# Patient Record
Sex: Female | Born: 1955 | Race: Black or African American | Hispanic: No | Marital: Married | Smoking: Never smoker
Health system: Southern US, Community
[De-identification: ages and names within clinical notes are randomized; demographics above are authoritative.]

## PROBLEM LIST (undated history)

## (undated) DIAGNOSIS — E079 Disorder of thyroid, unspecified: Secondary | ICD-10-CM

## (undated) DIAGNOSIS — K5190 Ulcerative colitis, unspecified, without complications: Secondary | ICD-10-CM

## (undated) DIAGNOSIS — I10 Essential (primary) hypertension: Secondary | ICD-10-CM

## (undated) DIAGNOSIS — F32A Depression, unspecified: Secondary | ICD-10-CM

## (undated) HISTORY — DX: Depression, unspecified: F32.A

## (undated) HISTORY — DX: Essential (primary) hypertension: I10

## (undated) HISTORY — DX: Ulcerative colitis, unspecified, without complications: K51.90

## (undated) HISTORY — DX: Disorder of thyroid, unspecified: E07.9

## (undated) HISTORY — PX: APPENDECTOMY: SHX54

---

## 2003-03-02 ENCOUNTER — Encounter: Payer: Self-pay | Admitting: Gastroenterology

## 2003-03-02 ENCOUNTER — Ambulatory Visit (HOSPITAL_COMMUNITY): Admission: RE | Admit: 2003-03-02 | Discharge: 2003-03-02 | Payer: Self-pay | Admitting: Gastroenterology

## 2003-03-16 ENCOUNTER — Encounter: Payer: Self-pay | Admitting: Gastroenterology

## 2003-03-16 ENCOUNTER — Ambulatory Visit (HOSPITAL_COMMUNITY): Admission: RE | Admit: 2003-03-16 | Discharge: 2003-03-16 | Payer: Self-pay | Admitting: Gastroenterology

## 2003-11-08 ENCOUNTER — Ambulatory Visit (HOSPITAL_COMMUNITY): Admission: RE | Admit: 2003-11-08 | Discharge: 2003-11-08 | Payer: Self-pay | Admitting: Gastroenterology

## 2005-01-01 IMAGING — CT CT ABDOMEN W/ CM
1 of 6 series · 14 of 32 positions shown, 19 images · IV contrast (omnipaque)
Comparison: Comparison is made with the 03/02/03 study.

CLINICAL DATA: Elevated liver function tests.  Liver lesions noted on previous CT and MR.  Please evaluate.
 CT ABDOMEN WITH CONTRAST
TECHNIQUE: Multidetector helical study performed during IV contrast enhancement with 100 cc of Omnipaque 300 injected at 2.5 cc/sec.

[Series 2: abd/pelvis 5.0 b30f · axial · 0.74mm/px · z∈[+826,+1216]mm · 14 of 90 slices shown, 19 images]
[im 6/90  soft-tissue]
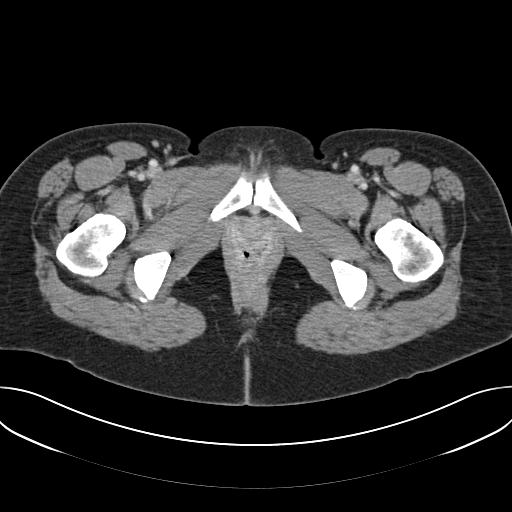
[im 6/90  bone]
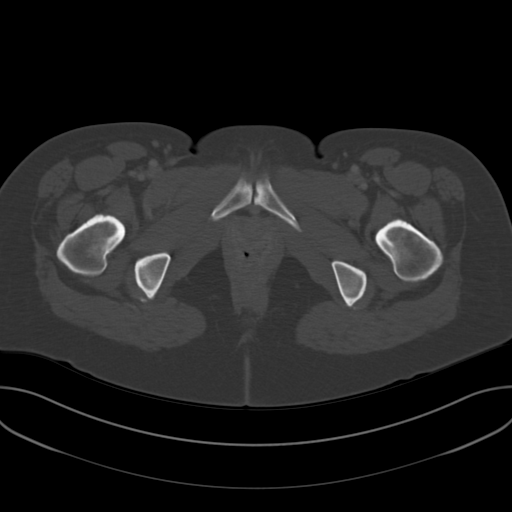
[im 12/90  soft-tissue]
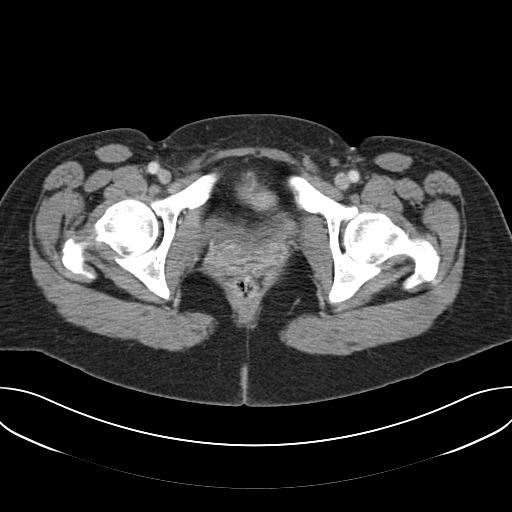
[im 17/90  soft-tissue]
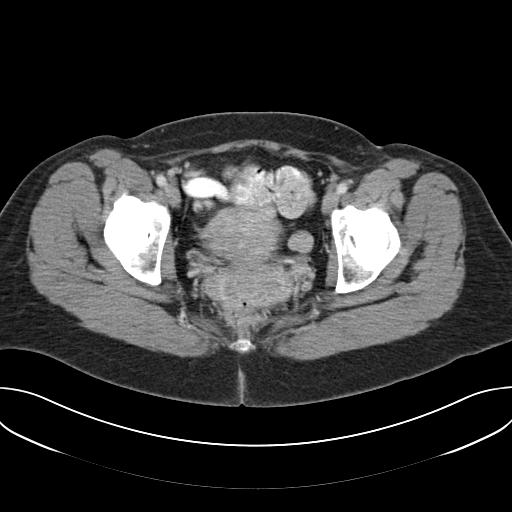
[im 28/90  soft-tissue]
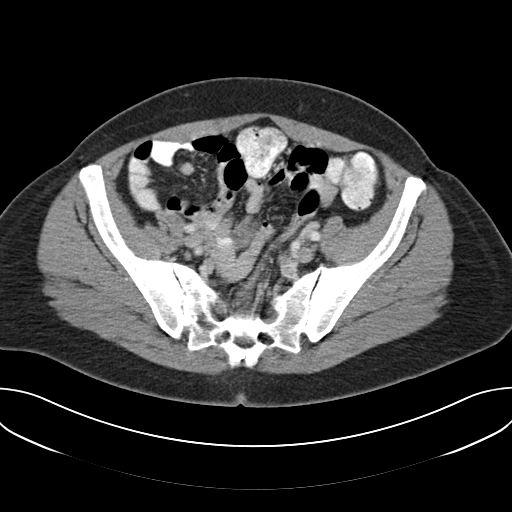
[im 34/90  soft-tissue]
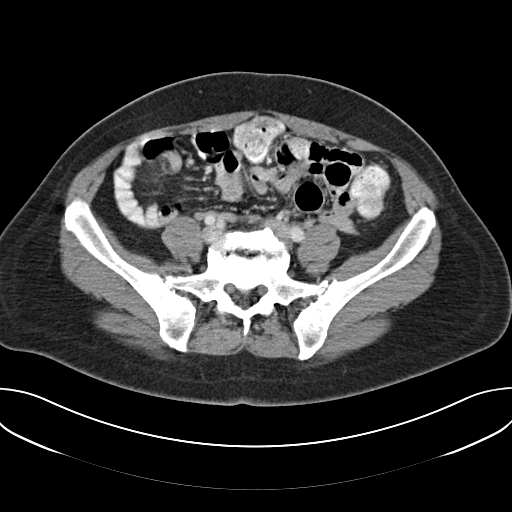
[im 39/90  soft-tissue]
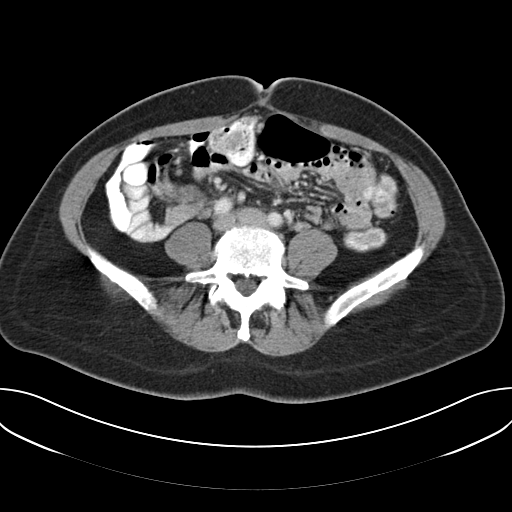
[im 45/90  soft-tissue]
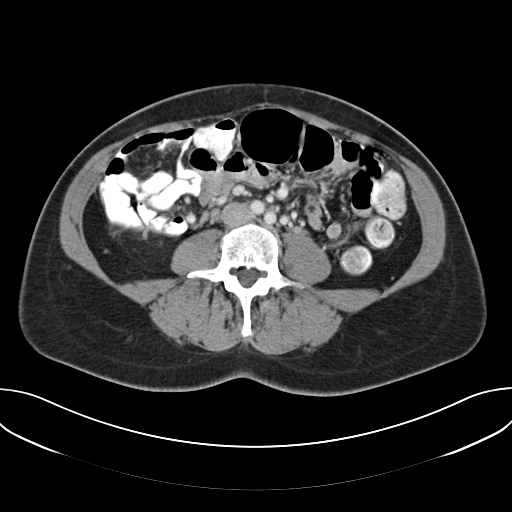
[im 51/90  soft-tissue]
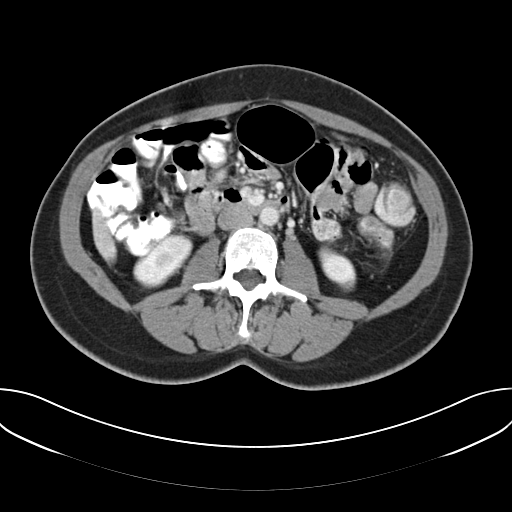
[im 56/90  soft-tissue]
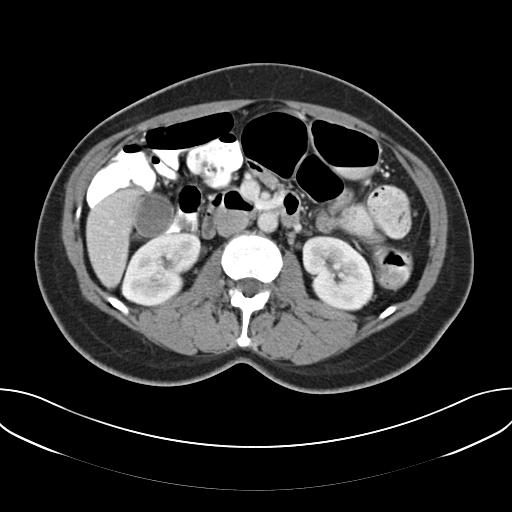
[im 56/90  bone]
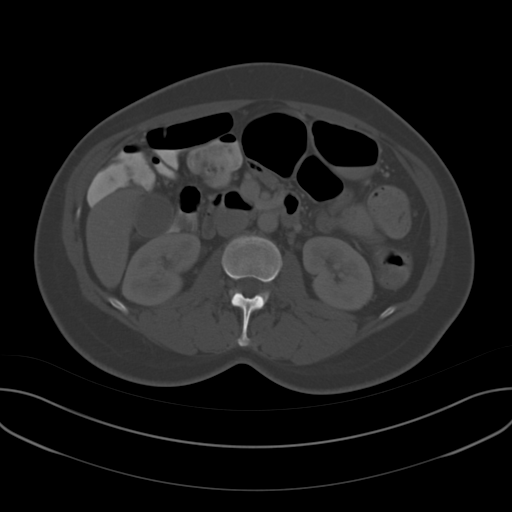
[im 62/90  soft-tissue]
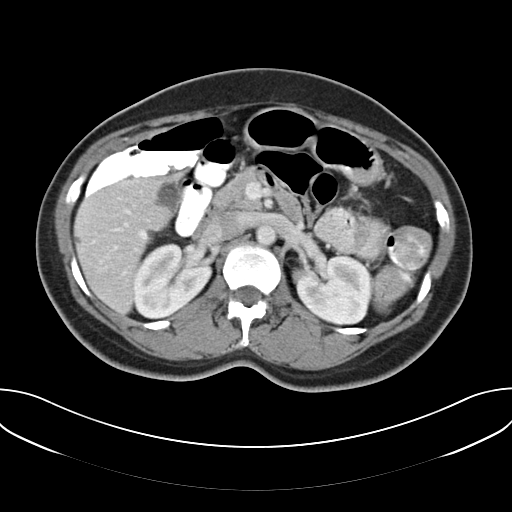
[im 67/90  lung]
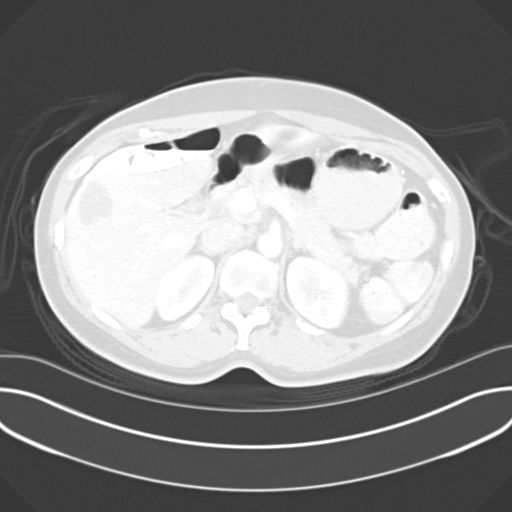
[im 73/90  soft-tissue]
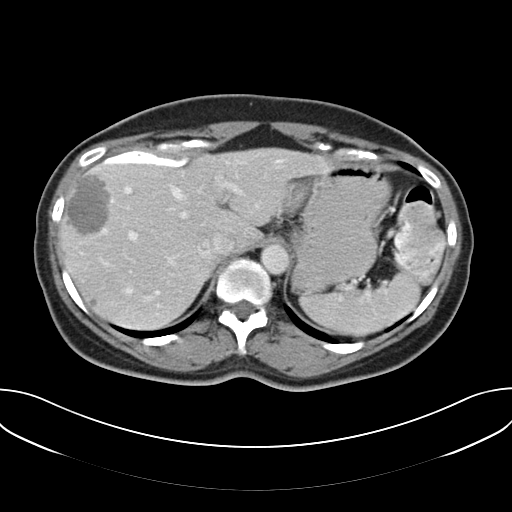
[im 73/90  lung]
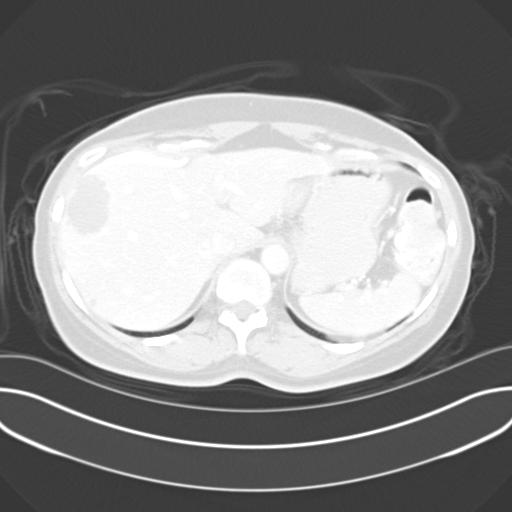
[im 78/90  soft-tissue]
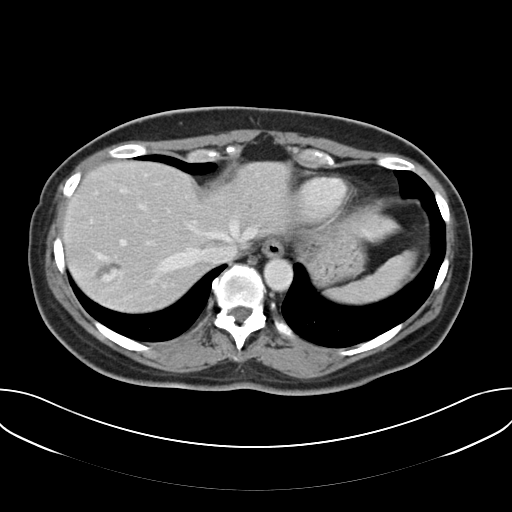
[im 78/90  lung]
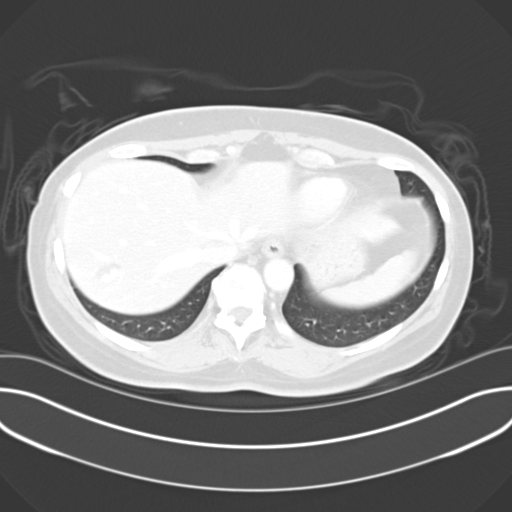
[im 84/90  soft-tissue]
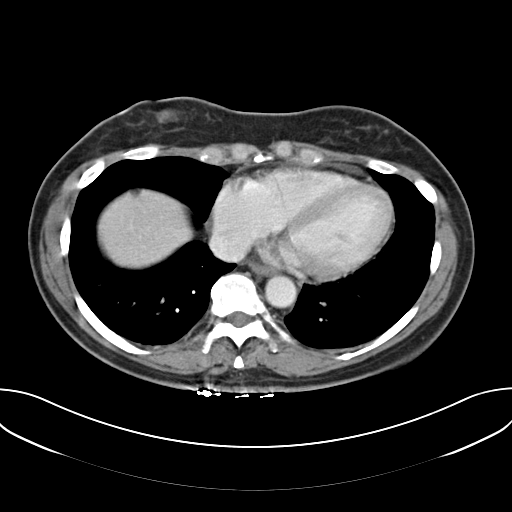
[im 84/90  lung]
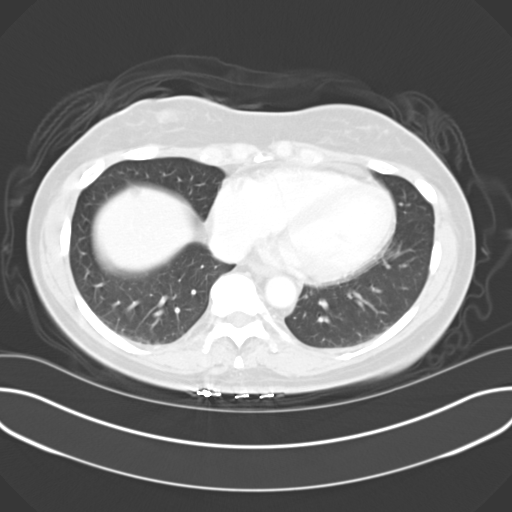

[14 of 32 positions shown; findings below may reference images not displayed]

FINDINGS: Again seen are the two lesions within the right lobe of the liver with peripheral nodular enhancement on the early portal venous phase.  These lesions fill in peripherally on the delayed scans and would be compatible with hemangiomata.  These have also undergone MR scanning and were compatible with hemangiomata on that evaluation.  There are also small stable cysts seen within the liver.  There is no intrahepatic or extrahepatic biliary dilatation.  The pancreas, spleen, kidneys, and adrenal glands have a normal appearance, and there is no retroperitoneal or mesenteric adenopathy.  There is no ascites.  The gallbladder is visualized and has a normal appearance.  
 IMPRESSION
 1.  Stable right lobe hepatic lesions with peripheral nodular enhancement and imaging characteristics consistent with hemangiomata.  
 2.  Stable small hepatic cysts.  
 3.  Otherwise negative CT of the abdomen. 
 CT PELVIS WITH CONTRAST 
 The uterus is prominent in size, but unchanged in appearance.  There may be multiple fibroids within the uterus.  This is more sensitively evaluated by pelvic ultrasound.  There is no free pelvic fluid.  No evidence for diverticulitis.  
 IMPRESSION
 1.  Prominent uterus -- unchanged in appearance when compared with the prior study -- question fibroid uterus.
 2.  Otherwise negative CT of the pelvis.

## 2014-06-09 ENCOUNTER — Encounter (INDEPENDENT_AMBULATORY_CARE_PROVIDER_SITE_OTHER): Payer: BC Managed Care – PPO | Admitting: Ophthalmology

## 2014-06-09 DIAGNOSIS — H4310 Vitreous hemorrhage, unspecified eye: Secondary | ICD-10-CM

## 2014-06-09 DIAGNOSIS — H2510 Age-related nuclear cataract, unspecified eye: Secondary | ICD-10-CM

## 2014-06-09 DIAGNOSIS — H33309 Unspecified retinal break, unspecified eye: Secondary | ICD-10-CM

## 2014-06-10 ENCOUNTER — Encounter (INDEPENDENT_AMBULATORY_CARE_PROVIDER_SITE_OTHER): Payer: BC Managed Care – PPO | Admitting: Ophthalmology

## 2014-06-10 DIAGNOSIS — H33309 Unspecified retinal break, unspecified eye: Secondary | ICD-10-CM

## 2014-06-17 ENCOUNTER — Ambulatory Visit (INDEPENDENT_AMBULATORY_CARE_PROVIDER_SITE_OTHER): Payer: BC Managed Care – PPO | Admitting: Ophthalmology

## 2014-06-17 DIAGNOSIS — H33309 Unspecified retinal break, unspecified eye: Secondary | ICD-10-CM

## 2014-10-18 ENCOUNTER — Ambulatory Visit (INDEPENDENT_AMBULATORY_CARE_PROVIDER_SITE_OTHER): Payer: BC Managed Care – PPO | Admitting: Ophthalmology

## 2018-07-17 ENCOUNTER — Ambulatory Visit (INDEPENDENT_AMBULATORY_CARE_PROVIDER_SITE_OTHER): Payer: BC Managed Care – PPO | Admitting: Internal Medicine

## 2022-12-24 ENCOUNTER — Encounter (INDEPENDENT_AMBULATORY_CARE_PROVIDER_SITE_OTHER): Payer: BC Managed Care – PPO | Admitting: Ophthalmology

## 2022-12-25 ENCOUNTER — Encounter (INDEPENDENT_AMBULATORY_CARE_PROVIDER_SITE_OTHER): Payer: BC Managed Care – PPO | Admitting: Ophthalmology

## 2022-12-25 ENCOUNTER — Ambulatory Visit (INDEPENDENT_AMBULATORY_CARE_PROVIDER_SITE_OTHER): Payer: Medicare PPO | Admitting: Ophthalmology

## 2022-12-25 ENCOUNTER — Encounter (INDEPENDENT_AMBULATORY_CARE_PROVIDER_SITE_OTHER): Payer: Self-pay | Admitting: Ophthalmology

## 2022-12-25 DIAGNOSIS — I10 Essential (primary) hypertension: Secondary | ICD-10-CM | POA: Diagnosis not present

## 2022-12-25 DIAGNOSIS — H34832 Tributary (branch) retinal vein occlusion, left eye, with macular edema: Secondary | ICD-10-CM | POA: Diagnosis not present

## 2022-12-25 DIAGNOSIS — H3581 Retinal edema: Secondary | ICD-10-CM

## 2022-12-25 DIAGNOSIS — H35033 Hypertensive retinopathy, bilateral: Secondary | ICD-10-CM | POA: Diagnosis not present

## 2022-12-25 DIAGNOSIS — H25813 Combined forms of age-related cataract, bilateral: Secondary | ICD-10-CM

## 2022-12-25 DIAGNOSIS — Z8669 Personal history of other diseases of the nervous system and sense organs: Secondary | ICD-10-CM

## 2022-12-25 DIAGNOSIS — H179 Unspecified corneal scar and opacity: Secondary | ICD-10-CM

## 2022-12-25 MED ORDER — BEVACIZUMAB CHEMO INJECTION 1.25MG/0.05ML SYRINGE FOR KALEIDOSCOPE
1.2500 mg | INTRAVITREAL | Status: AC | PRN
  Administered 2022-12-25: 1.25 mg via INTRAVITREAL
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
## 2023-01-08 NOTE — Progress Notes (Signed)
[facility] - Clinic Note  01/22/2023     CHIEF COMPLAINT Patient presents for Retina Follow Up   HISTORY OF PRESENT ILLNESS: Courtney Whitaker is a 67 y.o. female who presents to the clinic today for:   HPI     Retina Follow Up   Patient presents with  CRVO/BRVO.  In left eye.  Severity is moderate.  Duration of 4 weeks.  Since onset it is stable.  I, the attending physician,  performed the HPI with the patient and updated documentation appropriately.        Comments   Pt here for 4 wk ret f/u BRVO OS. Pt states she has been battling with dryness in OS. Using Refresh gtts which do help a little. S/p IVA she feels has improved VA a little.       Last edited by Rennis ChrisZamora, Brittainy Bucker, MD on 01/22/2023 12:32 PM.    Pt states she had no problem after her first injection at last visit  Referring physician: No referring provider defined for this encounter.  HISTORICAL INFORMATION:   Selected notes from the MEDICAL RECORD NUMBER Referred by Dr. Jimmey RalphParker for CSR LEE:  Ocular Hx- PMH-    CURRENT MEDICATIONS: Current Outpatient Medications (Ophthalmic Drugs)  Medication Sig   DUREZOL 0.05 % EMUL Place 1 drop into the right eye daily.   No current facility-administered medications for this visit. (Ophthalmic Drugs)   Current Outpatient Medications (Other)  Medication Sig   aspirin EC 81 MG tablet Take 81 mg by mouth daily.   citalopram (CELEXA) 40 MG tablet Take 40 mg by mouth daily.   cyanocobalamin (VITAMIN B12) 1000 MCG tablet Take 1,000 mcg by mouth daily.   Glucosamine-Chondroitin 500-400 MG CAPS Take 1 tablet by mouth daily.   levothyroxine (SYNTHROID) 25 MCG tablet Take 25 mcg by mouth daily before breakfast.   losartan (COZAAR) 50 MG tablet Take 50 mg by mouth daily.   Magnesium Gluconate 27.5 MG TABS Take 1 tablet by mouth daily.   mesalamine (LIALDA) 1.2 g EC tablet Take 1.2 g by mouth daily with breakfast.   metoprolol succinate (TOPROL-XL) 25 MG 24 hr  tablet Take 12.5 mg by mouth daily.   Omega 3 1000 MG CAPS Take 1 capsule by mouth daily.   valACYclovir (VALTREX) 500 MG tablet Take 500 mg by mouth 2 (two) times daily.   No current facility-administered medications for this visit. (Other)   REVIEW OF SYSTEMS: ROS   Positive for: Gastrointestinal, Endocrine, Cardiovascular, Eyes, Psychiatric Negative for: Constitutional, Neurological, Skin, Genitourinary, Musculoskeletal, HENT, Respiratory, Allergic/Imm, Heme/Lymph Last edited by Thompson GrayerSimpson, Makenzie E, COT on 01/22/2023  8:41 AM.      ALLERGIES Allergies  Allergen Reactions   Penicillins     PAST MEDICAL HISTORY Past Medical History:  Diagnosis Date   Depression    Hypertension    Thyroid disease    Ulcerative colitis    Past Surgical History:  Procedure Laterality Date   APPENDECTOMY     2023    FAMILY HISTORY Family History  Problem Relation Age of Onset   Diabetes Father     SOCIAL HISTORY Social History   Tobacco Use   Smoking status: Never   Smokeless tobacco: Never  Substance Use Topics   Alcohol use: Yes    Comment: Occasional, social   Drug use: Never       OPHTHALMIC EXAM:  Base Eye Exam     Visual Acuity (Snellen - Linear)  Right Left   Dist [place] 20/350 20/70 -2   Dist ph [place] 20/200 -1 20/30 -2         Tonometry (Tonopen, 9:01 AM)       Right Left   Pressure 22 15         Pupils       Dark Light Shape React APD   Right 3 3 Irregular NR None   Left 3 2 Round Brisk None         Visual Fields (Counting fingers)       Left Right    Full Full         Extraocular Movement       Right Left    Full, Ortho Full, Ortho         Neuro/Psych     Oriented x3: Yes   Mood/Affect: Normal         Dilation     Both eyes: 1.0% Mydriacyl, 2.5% Phenylephrine @ 9:01 AM           Slit Lamp and Fundus Exam     Slit Lamp Exam       Right Left   Lids/Lashes Normal Dermatochalasis - upper lid   Conjunctiva/Sclera  mild melanosis mild melanosis   Cornea Arcus, central corneal scar with central haze and mild thinning arcus, trace PEE, fine endo pigment   Anterior Chamber deep deep and clear, no cell or flare   Iris Round and dilated Dilated, PS at 0600   Lens 2-3+ Nuclear sclerosis, 2-3+ Cortical cataract, 3+ Posterior subcapsular cataract 2-3+ Nuclear sclerosis, 3+ Cortical cataract   Anterior Vitreous Vitreous syneresis, Posterior vitreous detachment, vitreous condensations          Fundus Exam       Right Left   Disc hazy view, sharp rim, mild pallor mild Pallor, Sharp rim, +cupping   C/D Ratio 0.6 0.7   Macula very hazy view, grossly flat, RPE mottling, no obvious heme Flat, Blunted foveal reflex, RPE mottling, cluster of IRH and edema SN macula -- improved, punctate exudates SN fovea -- improving   Vessels hazy view, attenuated, Tortuous attenuated, Tortuous, copper wiring, no sheathing   Periphery Very hazy view, grossly attached, no details visible Attached, HST with good laser surrounding at 0300           IMAGING AND PROCEDURES  Imaging and Procedures for 01/22/2023  OCT, Retina - OU - Both Eyes       Right Eye Quality was poor. Central Foveal Thickness: 1007. Progression has been stable. Findings include normal foveal contour, no IRF, no SRF (Very poor image).   Left Eye Quality was good. Central Foveal Thickness: 295. Progression has improved. Findings include abnormal foveal contour, intraretinal hyper-reflective material, intraretinal fluid, subretinal fluid (Interval improvement in IRF/edema greatest SN; focal subfoveal SRF -- persistent).   Notes *Images captured and stored on drive  Diagnosis / Impression:  OD: very poor image -- no obvious IRF/SRF OS: Interval improvement in IRF/edema greatest SN; focal subfoveal SRF - persistent  Clinical management:  See below  Abbreviations: NFP - Normal foveal profile. CME - cystoid macular edema. PED - pigment epithelial  detachment. IRF - intraretinal fluid. SRF - subretinal fluid. EZ - ellipsoid zone. ERM - epiretinal membrane. ORA - outer retinal atrophy. ORT - outer retinal tubulation. SRHM - subretinal hyper-reflective material. IRHM - intraretinal hyper-reflective material      Intravitreal Injection, Pharmacologic Agent - OS - Left Eye  Time Out 01/22/2023. 9:23 AM. Confirmed correct patient, procedure, site, and patient consented.   Anesthesia Topical anesthesia was used. Anesthetic medications included Lidocaine 4%, Proparacaine 0.5%.   Procedure Preparation included 5% betadine to ocular surface, eyelid speculum. A supplied needle was used.   Injection: 1.25 mg Bevacizumab 1.25mg /0.41ml   Route: Intravitreal, Site: Left Eye   NDC: P3213405, Lot: [id number], Expiration date: 02/10/2023   Post-op Post injection exam found visual acuity of at least counting fingers. The patient tolerated the procedure well. There were no complications. The patient received written and verbal post procedure care education.            ASSESSMENT/PLAN:    ICD-10-CM   1. 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 Essential hypertension  I10     3. Hypertensive retinopathy of both eyes  H35.033     4. History of retinal tear  Z86.69     5. 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 Combined forms of age-related cataract of both eyes  H25.813      BRVO w/ CME OS  - pt reports spikes in BP as adjustments have been made with thyroid and BP medications  - pt reports SBP max of 190s several times - FA 03.12.24 shows mild perivascular leakage SN macula - s/p IVA OS #1 (03.12.24) - BCVA improved to 20/30 from 20/60 OS - exam shows small cluster of IRH NS macula - OCT shows Interval improvement in  IRF/edema greatest SN; focal subfoveal SRF -- persistent - recommend IVA OS #2 today, 04.09.24 - RBA of procedure discussed, questions answered - IVA informed consent obtained and signed, 03.12.24 - see procedure note - F/U 4 weeks -- DFE/OCT/possible injection  2,3. Hypertensive retinopathy OU - discussed importance of tight BP control and relation to #1 above - monitor  4. Hx of retinal tears OU  - s/p laser retinopexy with Dr. Ashley Royalty (Aug 2015; OD 0800, OS 0300)  - stable  - no new RT/RD  5,6. 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 [name] for further evaluation and management  7. Mixed Cataract OU - The symptoms of cataract, surgical options, and treatments and risks were discussed with patient. - discussed diagnosis and progression - likely visually significant - under the expert management of Dr. [name] and [facility] Care   Ophthalmic Meds Ordered this visit:  Meds ordered this encounter  Medications   Bevacizumab (AVASTIN) SOLN 1.25 mg     Return in about 4 weeks (around 02/19/2023) for f/u BRVO OS, DFE, OCT, Possible Injxn.  There are no Patient Instructions on file for this visit.   Explained the diagnoses, plan, and follow up with the patient and they expressed understanding.  Patient expressed understanding of the importance of proper follow up care.   This document serves as a record of services personally performed by Karie Chimera, MD, PhD. It was created on their behalf by Gerilyn Nestle, COT an ophthalmic technician. The creation of this record is the provider's dictation and/or activities during the visit.    Electronically signed by:  Gerilyn Nestle, COT  03.26.24 12:38 PM  This document serves as a record  of services personally performed by Karie ChimeraBrian G. Elley Harp, MD, PhD. It was created on their behalf by Glee ArvinAmanda J. Manson PasseyBrown, OA an ophthalmic technician. The creation of this record is the provider's  dictation and/or activities during the visit.    Electronically signed by: Glee ArvinAmanda J. Kristopher OppenheimBrown, OA 04.09.2024 12:38 PM  Karie ChimeraBrian G. Iesha Summerhill, M.D., Ph.D. Diseases & Surgery of the Retina and Vitreous [facility]  I have reviewed the above documentation for accuracy and completeness, and I agree with the above. Karie ChimeraBrian G. Asad Keeven, M.D., Ph.D. 01/22/23 12:40 PM   Abbreviations: M myopia (nearsighted); A astigmatism; H hyperopia (farsighted); P presbyopia; Mrx spectacle prescription;  CTL contact lenses; OD right eye; OS left eye; OU both eyes  XT exotropia; ET esotropia; PEK punctate epithelial keratitis; PEE punctate epithelial erosions; DES dry eye syndrome; MGD meibomian gland dysfunction; ATs artificial tears; PFAT's preservative free artificial tears; NSC nuclear sclerotic cataract; PSC posterior subcapsular cataract; ERM epi-retinal membrane; PVD posterior vitreous detachment; RD retinal detachment; DM diabetes mellitus; DR diabetic retinopathy; NPDR non-proliferative diabetic retinopathy; PDR proliferative diabetic retinopathy; CSME clinically significant macular edema; DME diabetic macular edema; dbh dot blot hemorrhages; CWS cotton wool spot; POAG primary open angle glaucoma; C/D cup-to-disc ratio; HVF humphrey visual field; GVF goldmann visual field; OCT optical coherence tomography; IOP intraocular pressure; BRVO Branch retinal vein occlusion; CRVO central retinal vein occlusion; CRAO central retinal artery occlusion; BRAO branch retinal artery occlusion; RT retinal tear; SB scleral buckle; PPV pars plana vitrectomy; VH Vitreous hemorrhage; PRP panretinal laser photocoagulation; IVK intravitreal kenalog; VMT vitreomacular traction; MH Macular hole;  NVD neovascularization of the disc; NVE neovascularization elsewhere; AREDS age related eye disease study; ARMD age related macular degeneration; POAG primary open angle glaucoma; EBMD epithelial/anterior basement membrane dystrophy; ACIOL  anterior chamber intraocular lens; IOL intraocular lens; PCIOL posterior chamber intraocular lens; Phaco/IOL phacoemulsification with intraocular lens placement; PRK photorefractive keratectomy; LASIK laser assisted in situ keratomileusis; HTN hypertension; DM diabetes mellitus; COPD chronic obstructive pulmonary disease

## 2023-01-22 ENCOUNTER — Encounter (INDEPENDENT_AMBULATORY_CARE_PROVIDER_SITE_OTHER): Payer: Self-pay | Admitting: Ophthalmology

## 2023-01-22 ENCOUNTER — Ambulatory Visit (INDEPENDENT_AMBULATORY_CARE_PROVIDER_SITE_OTHER): Payer: Medicare PPO | Admitting: Ophthalmology

## 2023-01-22 DIAGNOSIS — H35033 Hypertensive retinopathy, bilateral: Secondary | ICD-10-CM

## 2023-01-22 DIAGNOSIS — I10 Essential (primary) hypertension: Secondary | ICD-10-CM | POA: Diagnosis not present

## 2023-01-22 DIAGNOSIS — Z8669 Personal history of other diseases of the nervous system and sense organs: Secondary | ICD-10-CM | POA: Diagnosis not present

## 2023-01-22 DIAGNOSIS — H25813 Combined forms of age-related cataract, bilateral: Secondary | ICD-10-CM

## 2023-01-22 DIAGNOSIS — H34832 Tributary (branch) retinal vein occlusion, left eye, with macular edema: Secondary | ICD-10-CM | POA: Diagnosis not present

## 2023-01-22 DIAGNOSIS — H179 Unspecified corneal scar and opacity: Secondary | ICD-10-CM

## 2023-01-22 MED ORDER — BEVACIZUMAB CHEMO INJECTION 1.25MG/0.05ML SYRINGE FOR KALEIDOSCOPE
1.2500 mg | INTRAVITREAL | Status: AC | PRN
Start: 2023-01-22 — End: 2023-01-22
  Administered 2023-01-22: 1.25 mg via INTRAVITREAL

## 2023-02-05 NOTE — Progress Notes (Signed)
[facility] - Clinic Note  02/19/2023     CHIEF COMPLAINT Patient presents for Retina Follow Up   HISTORY OF PRESENT ILLNESS: Courtney Whitaker is a 67 y.o. female who presents to the clinic today for:   HPI     Retina Follow Up   Patient presents with  CRVO/BRVO.  In left eye.  Severity is moderate.  Duration of 4 weeks.  Since onset it is stable.  I, the attending physician,  performed the HPI with the patient and updated documentation appropriately.        Comments   4 week Retina eval for Brvo os. Patient states not much change in vision      Last edited by [name], MD on 02/19/2023  1:00 PM.    Patient states she has started Cellcept and had pain for 2 weeks. She stopped Magnesium and B12 since. She is having shooting pains all over.   Referring physician: No referring provider defined for this encounter.  HISTORICAL INFORMATION:   Selected notes from the MEDICAL RECORD NUMBER Referred by Dr. [name] for CSR LEE:  Ocular Hx- PMH-    CURRENT MEDICATIONS: Current Outpatient Medications (Ophthalmic Drugs)  Medication Sig   DUREZOL 0.05 % EMUL Place 1 drop into the right eye daily.   No current facility-administered medications for this visit. (Ophthalmic Drugs)   Current Outpatient Medications (Other)  Medication Sig   aspirin EC 81 MG tablet Take 81 mg by mouth daily.   citalopram (CELEXA) 40 MG tablet Take 40 mg by mouth daily.   Glucosamine-Chondroitin 500-400 MG CAPS Take 1 tablet by mouth daily.   levothyroxine (SYNTHROID) 25 MCG tablet Take 25 mcg by mouth daily before breakfast.   losartan (COZAAR) 50 MG tablet Take 50 mg by mouth daily.   mesalamine (LIALDA) 1.2 g EC tablet Take 1.2 g by mouth daily with breakfast.   metoprolol succinate (TOPROL-XL) 25 MG 24 hr tablet Take 12.5 mg by mouth daily.   mycophenolate (CELLCEPT) 500 MG tablet Take 500 mg by mouth 2 (two) times daily.   Omega 3 1000 MG CAPS Take 1 capsule by mouth daily.    valACYclovir (VALTREX) 500 MG tablet Take 500 mg by mouth 2 (two) times daily.   cyanocobalamin (VITAMIN B12) 1000 MCG tablet Take 1,000 mcg by mouth daily. (Patient not taking: Reported on 02/19/2023)   Magnesium Gluconate 27.5 MG TABS Take 1 tablet by mouth daily. (Patient not taking: Reported on 02/19/2023)   No current facility-administered medications for this visit. (Other)   REVIEW OF SYSTEMS: ROS   Positive for: Gastrointestinal, Endocrine, Cardiovascular, Eyes, Psychiatric Negative for: Constitutional, Neurological, Skin, Genitourinary, Musculoskeletal, HENT, Respiratory, Allergic/Imm, Heme/Lymph Last edited by Lana Fish, COT on 02/19/2023  8:43 AM.     ALLERGIES Allergies  Allergen Reactions   Penicillins    PAST MEDICAL HISTORY Past Medical History:  Diagnosis Date   Depression    Hypertension    Thyroid disease    Ulcerative colitis (HCC)    Past Surgical History:  Procedure Laterality Date   APPENDECTOMY     2023    FAMILY HISTORY Family History  Problem Relation Age of Onset   Diabetes Father    SOCIAL HISTORY Social History   Tobacco Use   Smoking status: Never   Smokeless tobacco: Never  Substance Use Topics   Alcohol use: Yes    Comment: Occasional, social   Drug use: Never  OPHTHALMIC EXAM:  Base Eye Exam     Visual Acuity (Snellen - Linear)       Right Left   Dist [place] 20/400 20/60-2   Dist ph [place] 20/200-2 20/25-1         Tonometry (Tonopen, 8:46 AM)       Right Left   Pressure 21 19         Pupils       Dark Light Shape React APD   Right 3 3 Irregular NR None   Left 3 2 Round Brisk None         Visual Fields (Counting fingers)       Left Right    Full Full         Extraocular Movement       Right Left    Full, Ortho Full, Ortho         Neuro/Psych     Oriented x3: Yes   Mood/Affect: Normal         Dilation     Both eyes: 1.0% Mydriacyl, 2.5% Phenylephrine @ 8:46 AM         Dilation  #2     Both eyes: 1.0% Mydriacyl, 10% Phenylephrine @ 10:13 AM           Slit Lamp and Fundus Exam     Slit Lamp Exam       Right Left   Lids/Lashes Normal Dermatochalasis - upper lid   Conjunctiva/Sclera mild melanosis mild melanosis   Cornea Arcus, central corneal scar with central haze and mild thinning arcus, trace PEE, fine endo pigment   Anterior Chamber deep deep and clear, no cell or flare   Iris Round and dilated Dilated, PS at 0600   Lens 2-3+ Nuclear sclerosis, 2-3+ Cortical cataract, 3+ Posterior subcapsular cataract 2-3+ Nuclear sclerosis, 3+ Cortical cataract   Anterior Vitreous Vitreous syneresis, Posterior vitreous detachment, vitreous condensations          Fundus Exam       Right Left   Disc hazy view, sharp rim, mild pallor mild Pallor, Sharp rim, +cupping   C/D Ratio 0.6 0.7   Macula very hazy view, grossly flat, RPE mottling, no obvious heme Flat, Blunted foveal reflex, RPE mottling, cluster of IRH and edema SN macula -- improved, punctate exudates SN fovea -- improving   Vessels hazy view, attenuated, Tortuous attenuated, Tortuous, copper wiring, no sheathing   Periphery Very hazy view, grossly attached, no details visible Attached, HST with good laser surrounding at 0300           IMAGING AND PROCEDURES  Imaging and Procedures for 02/19/2023  OCT, Retina - OU - Both Eyes       Right Eye Quality was poor. Central Foveal Thickness: 370. Progression has been stable. Findings include normal foveal contour, no IRF, no SRF (Very poor image).   Left Eye Quality was good. Central Foveal Thickness: 243. Progression has improved. Findings include no SRF, abnormal foveal contour, intraretinal hyper-reflective material, intraretinal fluid, subretinal fluid (Interval improvement in IRF/edema greatest SN; focal subfoveal SRF -- improved).   Notes *Images captured and stored on drive  Diagnosis / Impression:  OD: very poor image -- no obvious IRF/SRF OS:  Interval improvement in IRF/edema greatest SN; focal subfoveal SRF - improved  Clinical management:  See below  Abbreviations: NFP - Normal foveal profile. CME - cystoid macular edema. PED - pigment epithelial detachment. IRF - intraretinal fluid. SRF - subretinal fluid. EZ - ellipsoid  zone. ERM - epiretinal membrane. ORA - outer retinal atrophy. ORT - outer retinal tubulation. SRHM - subretinal hyper-reflective material. IRHM - intraretinal hyper-reflective material      Intravitreal Injection, Pharmacologic Agent - OS - Left Eye       Time Out 02/19/2023. 10:01 AM. Confirmed correct patient, procedure, site, and patient consented.   Anesthesia Topical anesthesia was used. Anesthetic medications included Lidocaine 4%, Proparacaine 0.5%.   Procedure Preparation included 5% betadine to ocular surface, eyelid speculum. A supplied needle was used.   Injection: 1.25 mg Bevacizumab 1.25mg /0.61ml   Route: Intravitreal, Site: Left Eye   NDC: [id number], Lot: [id number] , Expiration date: 03/15/2023   Post-op Post injection exam found visual acuity of at least counting fingers. The patient tolerated the procedure well. There were no complications. The patient received written and verbal post procedure care education.            ASSESSMENT/PLAN:    ICD-10-CM   1. 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 Essential hypertension  I10     3. Hypertensive retinopathy of both eyes  H35.033     4. History of retinal tear  Z86.69     5. 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 Combined forms of age-related cataract of both eyes  H25.813      BRVO w/ CME OS - pt reports spikes in BP as adjustments have been made with thyroid and BP medications  - pt reports SBP max of 190s several  times - FA 03.12.24 shows mild perivascular leakage SN macula - s/p IVA OS #1 (03.12.24), #2 (04.09.24) - BCVA improved to 20/25 from 20/30 OS - exam shows small cluster of IRH NS macula improving - OCT shows Interval improvement in IRF/edema greatest SN; focal subfoveal SRF -- improved - recommend IVA OS #3 today, 05.07.24 - RBA of procedure discussed, questions answered - IVA informed consent obtained and signed, 03.12.24 - see procedure note - F/U 4 weeks -- DFE/OCT/possible injection  2,3. Hypertensive retinopathy OU - discussed importance of tight BP control and relation to #1 above - monitor  4. Hx of retinal tears OU  - s/p laser retinopexy with Dr. Ashley Royalty (Aug 2015; OD 0800, OS 0300)  - stable  - no new RT/RD  5,6. 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 [name] for further evaluation and management -- appt in July  - pt saw Dr. [name] on 04.16.24 for evaluation and management of uveitis / ocular inflammation  - work up initiated by Dr. [name] and pt was started on Cellcept 500 mg BID -- f/u in June  7. Mixed Cataract OU - The symptoms of cataract, surgical options, and treatments and risks were discussed with patient. - discussed diagnosis and progression - likely visually significant - under the expert management of Dr. [name] and [facility] Care   Ophthalmic Meds Ordered this visit:  Meds ordered this encounter  Medications   Bevacizumab (AVASTIN) SOLN 1.25 mg     Return in about 4 weeks (around 03/19/2023) for f/u BRVO OS , DFE, OCT, Possible, IVA, OS.  There are no Patient Instructions on file for this visit.   Explained the  diagnoses, plan, and follow up with the patient and they expressed understanding.  Patient expressed understanding of the importance of proper follow up care.   This document serves as a record of services personally performed by Karie Chimera, MD, PhD. It was created on their behalf by  Gerilyn Nestle, COT an ophthalmic technician. The creation of this record is the provider's dictation and/or activities during the visit.    Electronically signed by:  Gerilyn Nestle, COT  5.07.24 1:01 PM  Karie Chimera, M.D., Ph.D. Diseases & Surgery of the Retina and Vitreous [facility]  I have reviewed the above documentation for accuracy and completeness, and I agree with the above. Karie Chimera, M.D., Ph.D. 02/19/23 1:05 PM   Abbreviations: M myopia (nearsighted); A astigmatism; H hyperopia (farsighted); P presbyopia; Mrx spectacle prescription;  CTL contact lenses; OD right eye; OS left eye; OU both eyes  XT exotropia; ET esotropia; PEK punctate epithelial keratitis; PEE punctate epithelial erosions; DES dry eye syndrome; MGD meibomian gland dysfunction; ATs artificial tears; PFAT's preservative free artificial tears; NSC nuclear sclerotic cataract; PSC posterior subcapsular cataract; ERM epi-retinal membrane; PVD posterior vitreous detachment; RD retinal detachment; DM diabetes mellitus; DR diabetic retinopathy; NPDR non-proliferative diabetic retinopathy; PDR proliferative diabetic retinopathy; CSME clinically significant macular edema; DME diabetic macular edema; dbh dot blot hemorrhages; CWS cotton wool spot; POAG primary open angle glaucoma; C/D cup-to-disc ratio; HVF humphrey visual field; GVF goldmann visual field; OCT optical coherence tomography; IOP intraocular pressure; BRVO Branch retinal vein occlusion; CRVO central retinal vein occlusion; CRAO central retinal artery occlusion; BRAO branch retinal artery occlusion; RT retinal tear; SB scleral buckle; PPV pars plana vitrectomy; VH Vitreous hemorrhage; PRP panretinal laser photocoagulation; IVK intravitreal kenalog; VMT vitreomacular traction; MH Macular hole;  NVD neovascularization of the disc; NVE neovascularization elsewhere; AREDS age related eye disease study; ARMD age related macular  degeneration; POAG primary open angle glaucoma; EBMD epithelial/anterior basement membrane dystrophy; ACIOL anterior chamber intraocular lens; IOL intraocular lens; PCIOL posterior chamber intraocular lens; Phaco/IOL phacoemulsification with intraocular lens placement; PRK photorefractive keratectomy; LASIK laser assisted in situ keratomileusis; HTN hypertension; DM diabetes mellitus; COPD chronic obstructive pulmonary disease

## 2023-02-19 ENCOUNTER — Ambulatory Visit (INDEPENDENT_AMBULATORY_CARE_PROVIDER_SITE_OTHER): Payer: Medicare PPO | Admitting: Ophthalmology

## 2023-02-19 ENCOUNTER — Encounter (INDEPENDENT_AMBULATORY_CARE_PROVIDER_SITE_OTHER): Payer: Self-pay | Admitting: Ophthalmology

## 2023-02-19 DIAGNOSIS — I10 Essential (primary) hypertension: Secondary | ICD-10-CM

## 2023-02-19 DIAGNOSIS — Z8669 Personal history of other diseases of the nervous system and sense organs: Secondary | ICD-10-CM

## 2023-02-19 DIAGNOSIS — H35033 Hypertensive retinopathy, bilateral: Secondary | ICD-10-CM | POA: Diagnosis not present

## 2023-02-19 DIAGNOSIS — H25813 Combined forms of age-related cataract, bilateral: Secondary | ICD-10-CM

## 2023-02-19 DIAGNOSIS — H34832 Tributary (branch) retinal vein occlusion, left eye, with macular edema: Secondary | ICD-10-CM

## 2023-02-19 DIAGNOSIS — H179 Unspecified corneal scar and opacity: Secondary | ICD-10-CM

## 2023-02-19 MED ORDER — BEVACIZUMAB CHEMO INJECTION 1.25MG/0.05ML SYRINGE FOR KALEIDOSCOPE
1.2500 mg | INTRAVITREAL | Status: AC | PRN
Start: 2023-02-19 — End: 2023-02-19
  Administered 2023-02-19: 1.25 mg via INTRAVITREAL

## 2023-03-05 NOTE — Progress Notes (Signed)
[facility] - Clinic Note  03/19/2023     CHIEF COMPLAINT Patient presents for Retina Follow Up   HISTORY OF PRESENT ILLNESS: Courtney Whitaker is a 67 y.o. female who presents to the clinic today for:   HPI     Retina Follow Up   Patient presents with  CRVO/BRVO.  In left eye.  This started 4 weeks ago.  Duration of 4 weeks.  Since onset it is stable.  I, the attending physician,  performed the HPI with the patient and updated documentation appropriately.        Comments   4 week retina follow up and IVA OS pt is reporting no vision changes noticed she has noticed floaters denies any flashes       Last edited by [name], MD on 03/19/2023 12:23 PM.    Patient saw Dr. [name] on Friday, she was told to use Durezol BID OD only, she states she has pain on the right side of her head, but it doesn't last long  Referring physician: No referring provider defined for this encounter.  HISTORICAL INFORMATION:   Selected notes from the MEDICAL RECORD NUMBER Referred by Dr. [name] for CSR LEE:  Ocular Hx- PMH-    CURRENT MEDICATIONS: Current Outpatient Medications (Ophthalmic Drugs)  Medication Sig   brimonidine (ALPHAGAN) 0.2 % ophthalmic solution Place 1 drop into both eyes 2 (two) times daily.   DUREZOL 0.05 % EMUL Place 1 drop into the right eye daily.   No current facility-administered medications for this visit. (Ophthalmic Drugs)   Current Outpatient Medications (Other)  Medication Sig   aspirin EC 81 MG tablet Take 81 mg by mouth daily.   citalopram (CELEXA) 40 MG tablet Take 40 mg by mouth daily.   cyanocobalamin (VITAMIN B12) 1000 MCG tablet Take 1,000 mcg by mouth daily. (Patient not taking: Reported on 02/19/2023)   Glucosamine-Chondroitin 500-400 MG CAPS Take 1 tablet by mouth daily.   levothyroxine (SYNTHROID) 25 MCG tablet Take 25 mcg by mouth daily before breakfast.   losartan (COZAAR) 50 MG tablet Take 50 mg by mouth daily.   Magnesium  Gluconate 27.5 MG TABS Take 1 tablet by mouth daily. (Patient not taking: Reported on 02/19/2023)   mesalamine (LIALDA) 1.2 g EC tablet Take 1.2 g by mouth daily with breakfast.   metoprolol succinate (TOPROL-XL) 25 MG 24 hr tablet Take 12.5 mg by mouth daily.   mycophenolate (CELLCEPT) 500 MG tablet Take 500 mg by mouth 2 (two) times daily.   Omega 3 1000 MG CAPS Take 1 capsule by mouth daily.   valACYclovir (VALTREX) 500 MG tablet Take 500 mg by mouth 2 (two) times daily.   No current facility-administered medications for this visit. (Other)   REVIEW OF SYSTEMS: ROS   Positive for: Gastrointestinal, Endocrine, Cardiovascular, Eyes, Psychiatric Negative for: Constitutional, Neurological, Skin, Genitourinary, Musculoskeletal, HENT, Respiratory, Allergic/Imm, Heme/Lymph Last edited by Etheleen Mayhew, COT on 03/19/2023  7:59 AM.     ALLERGIES Allergies  Allergen Reactions   Penicillins    PAST MEDICAL HISTORY Past Medical History:  Diagnosis Date   Depression    Hypertension    Thyroid disease    Ulcerative colitis (HCC)    Past Surgical History:  Procedure Laterality Date   APPENDECTOMY     2023    FAMILY HISTORY Family History  Problem Relation Age of Onset   Diabetes Father    SOCIAL HISTORY Social History   Tobacco Use  Smoking status: Never   Smokeless tobacco: Never  Substance Use Topics   Alcohol use: Yes    Comment: Occasional, social   Drug use: Never       OPHTHALMIC EXAM:  Base Eye Exam     Visual Acuity (Snellen - Linear)       Right Left   Dist [place] 20/400 20/50   Dist ph [place] 20/150 20/25 -1         Tonometry (Tonopen, 8:05 AM)       Right Left   Pressure 22 24         Pupils       Pupils Dark Light Shape React APD   Right PERRL 3 2 Round Brisk None   Left PERRL 3 2 Round Brisk None         Visual Fields       Left Right    Full Full         Extraocular Movement       Right Left    Full, Ortho Full, Ortho          Neuro/Psych     Oriented x3: Yes   Mood/Affect: Normal         Dilation     Both eyes: 2.5% Phenylephrine @ 8:05 AM           Slit Lamp and Fundus Exam     Slit Lamp Exam       Right Left   Lids/Lashes Normal Dermatochalasis - upper lid   Conjunctiva/Sclera mild melanosis mild melanosis   Cornea Arcus, central corneal scar with central haze and mild thinning arcus, trace PEE, fine endo pigment   Anterior Chamber deep deep and clear, no cell or flare   Iris Round and dilated Dilated, PS at 0600   Lens 2-3+ Nuclear sclerosis, 2-3+ Cortical cataract, 3+ Posterior subcapsular cataract 2-3+ Nuclear sclerosis, 3+ Cortical cataract   Anterior Vitreous Vitreous syneresis, Posterior vitreous detachment, vitreous condensations          Fundus Exam       Right Left   Disc hazy view, sharp rim, mild pallor mild Pallor, Sharp rim, +cupping   C/D Ratio 0.6 0.7   Macula very hazy view, grossly flat, RPE mottling, no obvious heme Flat, good foveal reflex, RPE mottling, cluster of IRH and edema SN macula -- improved, punctate exudates SN fovea -- improving   Vessels hazy view, attenuated, Tortuous attenuated, Tortuous, copper wiring, no sheathing   Periphery Very hazy view, grossly attached, no details visible Attached, HST with good laser surrounding at 0300           IMAGING AND PROCEDURES  Imaging and Procedures for 03/19/2023  OCT, Retina - OU - Both Eyes       Right Eye Quality was poor. Central Foveal Thickness: 370. Progression has been stable. Findings include no IRF, no SRF (Very poor image, no IRF/SRF -- foveal contour not visible).   Left Eye Quality was good. Central Foveal Thickness: 242. Progression has improved. Findings include normal foveal contour, no SRF, intraretinal hyper-reflective material, intraretinal fluid, subretinal fluid (Interval improvement in IRF/IRHM greatest SN; focal subfoveal SRF -- stably improved).   Notes *Images captured and  stored on drive  Diagnosis / Impression:  OD: very poor image -- no obvious IRF/SRF OS: Interval improvement in IRF/IRHM greatest SN; focal subfoveal SRF -- stably improved  Clinical management:  See below  Abbreviations: NFP - Normal foveal profile. CME - cystoid macular  edema. PED - pigment epithelial detachment. IRF - intraretinal fluid. SRF - subretinal fluid. EZ - ellipsoid zone. ERM - epiretinal membrane. ORA - outer retinal atrophy. ORT - outer retinal tubulation. SRHM - subretinal hyper-reflective material. IRHM - intraretinal hyper-reflective material      Intravitreal Injection, Pharmacologic Agent - OS - Left Eye       Time Out 03/19/2023. 8:34 AM. Confirmed correct patient, procedure, site, and patient consented.   Anesthesia Topical anesthesia was used. Anesthetic medications included Lidocaine 4%, Proparacaine 0.5%.   Procedure Preparation included 5% betadine to ocular surface, eyelid speculum. A (32g) needle was used.   Injection: 1.25 mg Bevacizumab 1.25mg /0.34ml   Route: Intravitreal, Site: Left Eye   NDC: [id number], Lot: [id number], Expiration date: 06/15/2023   Post-op Post injection exam found visual acuity of at least counting fingers. The patient tolerated the procedure well. There were no complications. The patient received written and verbal post procedure care education.             ASSESSMENT/PLAN:    ICD-10-CM   1. 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 Essential hypertension  I10     3. Hypertensive retinopathy of both eyes  H35.033     4. History of retinal tear  Z86.69     5. 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 Combined forms of age-related cataract of both eyes  H25.813      BRVO w/ CME OS - pt reports spikes in BP as  adjustments have been made with thyroid and BP medications  - pt reports SBP max of 190s several times - FA 03.12.24 shows mild perivascular leakage SN macula - s/p IVA OS #1 (03.12.24), #2 (04.09.24), #3 (05.07.24) - BCVA stable at 20/25 - exam shows small cluster of IRH NS macula improving - OCT shows Interval improvement in IRF/IRHM greatest SN; focal subfoveal SRF -- stably improved at 4 weeks - recommend IVA OS #4 today, 06.04.24 with follow up extended to 5 weeks - RBA of procedure discussed, questions answered - IVA informed consent obtained and signed, 03.12.24 - see procedure note - F/U 5 weeks -- DFE/OCT/possible injection  2,3. Hypertensive retinopathy OU - discussed importance of tight BP control and relation to #1 above - monitor  4. Hx of retinal tears OU  - s/p laser retinopexy with Dr. Ashley Royalty (Aug 2015; OD 0800, OS 0300)  - stable  - no new RT/RD  5,6. 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 [name] for further evaluation and management -- appt in July  - pt saw Dr. [name] on 04.16.24 for evaluation and management of uveitis / ocular inflammation  - work up initiated by Dr. [name] and pt was started on Cellcept 500 mg BID -- f/u in June  7. Mixed Cataract OU - The symptoms of cataract, surgical options, and treatments and risks were discussed with patient. - discussed diagnosis and progression - likely visually significant - under the expert management of Dr. [name] and [facility]  8. Ocular hypertension - IOP today: 22,24 - will start brimonidine BID OU   Ophthalmic Meds Ordered this visit:  Meds ordered this encounter  Medications  brimonidine (ALPHAGAN) 0.2 % ophthalmic solution    Sig: Place 1 drop into both eyes 2 (two) times daily.    Dispense:  10 mL    Refill:  10   Bevacizumab (AVASTIN) SOLN 1.25 mg     Return in about 5 weeks (around 04/23/2023) for f/u BRVO OS, DFE, OCT.  There are no  Patient Instructions on file for this visit.   Explained the diagnoses, plan, and follow up with the patient and they expressed understanding.  Patient expressed understanding of the importance of proper follow up care.   This document serves as a record of services personally performed by Karie Chimera, MD, PhD. It was created on their behalf by Gerilyn Nestle, COT an ophthalmic technician. The creation of this record is the provider's dictation and/or activities during the visit.    Electronically signed by:  Gerilyn Nestle, COT  5.21.24 12:24 PM  This document serves as a record of services personally performed by Karie Chimera, MD, PhD. It was created on their behalf by Glee Arvin. Manson Passey, OA an ophthalmic technician. The creation of this record is the provider's dictation and/or activities during the visit.    Electronically signed by: Glee Arvin. Manson Passey, [place] 06.04.2024 12:24 PM  Karie Chimera, M.D., Ph.D. Diseases & Surgery of the Retina and Vitreous [facility]  I have reviewed the above documentation for accuracy and completeness, and I agree with the above. Karie Chimera, M.D., Ph.D. 03/19/23 12:25 PM   Abbreviations: M myopia (nearsighted); A astigmatism; H hyperopia (farsighted); P presbyopia; Mrx spectacle prescription;  CTL contact lenses; OD right eye; OS left eye; OU both eyes  XT exotropia; ET esotropia; PEK punctate epithelial keratitis; PEE punctate epithelial erosions; DES dry eye syndrome; MGD meibomian gland dysfunction; ATs artificial tears; PFAT's preservative free artificial tears; NSC nuclear sclerotic cataract; PSC posterior subcapsular cataract; ERM epi-retinal membrane; PVD posterior vitreous detachment; RD retinal detachment; DM diabetes mellitus; DR diabetic retinopathy; NPDR non-proliferative diabetic retinopathy; PDR proliferative diabetic retinopathy; CSME clinically significant macular edema; DME diabetic macular edema; dbh dot  blot hemorrhages; CWS cotton wool spot; POAG primary open angle glaucoma; C/D cup-to-disc ratio; HVF humphrey visual field; GVF goldmann visual field; OCT optical coherence tomography; IOP intraocular pressure; BRVO Branch retinal vein occlusion; CRVO central retinal vein occlusion; CRAO central retinal artery occlusion; BRAO branch retinal artery occlusion; RT retinal tear; SB scleral buckle; PPV pars plana vitrectomy; VH Vitreous hemorrhage; PRP panretinal laser photocoagulation; IVK intravitreal kenalog; VMT vitreomacular traction; MH Macular hole;  NVD neovascularization of the disc; NVE neovascularization elsewhere; AREDS age related eye disease study; ARMD age related macular degeneration; POAG primary open angle glaucoma; EBMD epithelial/anterior basement membrane dystrophy; ACIOL anterior chamber intraocular lens; IOL intraocular lens; PCIOL posterior chamber intraocular lens; Phaco/IOL phacoemulsification with intraocular lens placement; PRK photorefractive keratectomy; LASIK laser assisted in situ keratomileusis; HTN hypertension; DM diabetes mellitus; COPD chronic obstructive pulmonary disease

## 2023-03-19 ENCOUNTER — Encounter (INDEPENDENT_AMBULATORY_CARE_PROVIDER_SITE_OTHER): Payer: Self-pay | Admitting: Ophthalmology

## 2023-03-19 ENCOUNTER — Ambulatory Visit (INDEPENDENT_AMBULATORY_CARE_PROVIDER_SITE_OTHER): Payer: Medicare PPO | Admitting: Ophthalmology

## 2023-03-19 DIAGNOSIS — H35033 Hypertensive retinopathy, bilateral: Secondary | ICD-10-CM

## 2023-03-19 DIAGNOSIS — H34832 Tributary (branch) retinal vein occlusion, left eye, with macular edema: Secondary | ICD-10-CM | POA: Diagnosis not present

## 2023-03-19 DIAGNOSIS — I10 Essential (primary) hypertension: Secondary | ICD-10-CM

## 2023-03-19 DIAGNOSIS — Z8669 Personal history of other diseases of the nervous system and sense organs: Secondary | ICD-10-CM

## 2023-03-19 DIAGNOSIS — H179 Unspecified corneal scar and opacity: Secondary | ICD-10-CM

## 2023-03-19 DIAGNOSIS — H25813 Combined forms of age-related cataract, bilateral: Secondary | ICD-10-CM

## 2023-03-19 MED ORDER — BEVACIZUMAB CHEMO INJECTION 1.25MG/0.05ML SYRINGE FOR KALEIDOSCOPE
1.2500 mg | INTRAVITREAL | Status: AC | PRN
Start: 2023-03-19 — End: 2023-03-19
  Administered 2023-03-19: 1.25 mg via INTRAVITREAL

## 2023-03-19 MED ORDER — BRIMONIDINE TARTRATE 0.2 % OP SOLN: 1.0000 [drp] | Freq: Two times a day (BID) | OPHTHALMIC | 10 refills | Status: DC

## 2023-04-09 NOTE — Progress Notes (Signed)
[facility] - Clinic Note  04/23/2023     CHIEF COMPLAINT Patient presents for Retina Follow Up   HISTORY OF PRESENT ILLNESS: Courtney Whitaker is a 67 y.o. female who presents to the clinic today for:   HPI     Retina Follow Up   Patient presents with  CRVO/BRVO.  In left eye.  This started months ago.  Duration of 5 weeks.  Since onset it is stable.  I, the attending physician,  performed the HPI with the patient and updated documentation appropriately.        Comments   Patient feels that the vision is the same. She is using Durezol OU BID, Alphagan OU BID - makes the eye ache.       Last edited by [name], MD on 04/23/2023  9:49 PM.     Patient states that the Cellcept makes her body hurt. She was told that she would have to take the medication for 10 years. Dr. [name] suggested to her to see a Rheumatologist. She did not make another appointment with Dr. [name]. She has an appointment with Dr. Tomi Bamberger tomorrow. She has an ache on the right side of her head, behind the eyeball,  and face. She is complaining of dry eyes due to the Durezol.   Referring physician: No referring provider defined for this encounter.  HISTORICAL INFORMATION:   Selected notes from the MEDICAL RECORD NUMBER Referred by Dr. [name] for CSR LEE:  Ocular Hx- PMH-    CURRENT MEDICATIONS: Current Outpatient Medications (Ophthalmic Drugs)  Medication Sig   brimonidine (ALPHAGAN) 0.2 % ophthalmic solution Place 1 drop into both eyes 2 (two) times daily.   DUREZOL 0.05 % EMUL Place 1 drop into the right eye daily.   No current facility-administered medications for this visit. (Ophthalmic Drugs)   Current Outpatient Medications (Other)  Medication Sig   aspirin EC 81 MG tablet Take 81 mg by mouth daily.   citalopram (CELEXA) 40 MG tablet Take 40 mg by mouth daily.   cyanocobalamin (VITAMIN B12) 1000 MCG tablet Take 1,000 mcg by mouth daily. (Patient not taking: Reported on  02/19/2023)   Glucosamine-Chondroitin 500-400 MG CAPS Take 1 tablet by mouth daily.   levothyroxine (SYNTHROID) 25 MCG tablet Take 25 mcg by mouth daily before breakfast.   losartan (COZAAR) 50 MG tablet Take 50 mg by mouth daily.   Magnesium Gluconate 27.5 MG TABS Take 1 tablet by mouth daily. (Patient not taking: Reported on 02/19/2023)   mesalamine (LIALDA) 1.2 g EC tablet Take 1.2 g by mouth daily with breakfast.   metoprolol succinate (TOPROL-XL) 25 MG 24 hr tablet Take 12.5 mg by mouth daily.   mycophenolate (CELLCEPT) 500 MG tablet Take 500 mg by mouth 2 (two) times daily.   Omega 3 1000 MG CAPS Take 1 capsule by mouth daily.   valACYclovir (VALTREX) 500 MG tablet Take 500 mg by mouth 2 (two) times daily.   No current facility-administered medications for this visit. (Other)   REVIEW OF SYSTEMS: ROS   Positive for: Gastrointestinal, Endocrine, Cardiovascular, Eyes, Psychiatric Negative for: Constitutional, Neurological, Skin, Genitourinary, Musculoskeletal, HENT, Respiratory, Allergic/Imm, Heme/Lymph Last edited by Charlette Caffey, COT on 04/23/2023  9:19 AM.      ALLERGIES Allergies  Allergen Reactions   Penicillins    PAST MEDICAL HISTORY Past Medical History:  Diagnosis Date   Depression    Hypertension    Thyroid disease    Ulcerative colitis (HCC)  Past Surgical History:  Procedure Laterality Date   APPENDECTOMY     2023    FAMILY HISTORY Family History  Problem Relation Age of Onset   Diabetes Father    SOCIAL HISTORY Social History   Tobacco Use   Smoking status: Never   Smokeless tobacco: Never  Substance Use Topics   Alcohol use: Yes    Comment: Occasional, social   Drug use: Never       OPHTHALMIC EXAM:  Base Eye Exam     Visual Acuity (Snellen - Linear)       Right Left   Dist [place] 20/300 20/50   Dist ph [place] 20/150 20/30         Tonometry (Tonopen, 9:27 AM)       Right Left   Pressure 22 20         Pupils       Dark Light  Shape React APD   Right 3 3 Irregular NR None   Left 3 2 Round Slow None         Visual Fields       Left Right    Full Full         Extraocular Movement       Right Left    Full, Ortho Full, Ortho         Neuro/Psych     Oriented x3: Yes   Mood/Affect: Normal         Dilation     Both eyes: 1.0% Mydriacyl, 2.5% Phenylephrine @ 9:20 AM           Slit Lamp and Fundus Exam     Slit Lamp Exam       Right Left   Lids/Lashes Normal Dermatochalasis - upper lid   Conjunctiva/Sclera mild melanosis mild melanosis   Cornea Arcus, central corneal scar with central haze and mild thinning arcus, trace PEE, fine endo pigment   Anterior Chamber deep deep and clear, no cell or flare   Iris Round and dilated Dilated, PS at 0600   Lens 2-3+ Nuclear sclerosis, 2-3+ Cortical cataract, 3+ Posterior subcapsular cataract 2-3+ Nuclear sclerosis, 3+ Cortical cataract   Anterior Vitreous Vitreous syneresis, Posterior vitreous detachment, vitreous condensations          Fundus Exam       Right Left   Disc hazy view, sharp rim, mild pallor mild Pallor, Sharp rim, +cupping   C/D Ratio 0.6 0.7   Macula very hazy view, grossly flat, RPE mottling, no obvious heme Flat, good foveal reflex, RPE mottling, cluster of IRH and edema SN macula -- improved, punctate exudates SN fovea -- improving   Vessels hazy view, attenuated, Tortuous attenuated, Tortuous, copper wiring, no sheathing   Periphery Very hazy view, grossly attached, no details visible Attached, HST with good laser surrounding at 0300           IMAGING AND PROCEDURES  Imaging and Procedures for 04/23/2023  OCT, Retina - OU - Both Eyes       Right Eye Quality was poor. Central Foveal Thickness: 973. Progression has been stable. Findings include no IRF, no SRF (Very poor image, no IRF/SRF -- foveal contour poorly visible appears normal contour).   Left Eye Quality was good. Central Foveal Thickness: 237. Progression  has improved. Findings include normal foveal contour, no IRF, no SRF, intraretinal hyper-reflective material, intraretinal fluid, subretinal fluid (Interval improvement in IRF/IRHM greatest SN; focal subfoveal SRF -- stably improved).   Notes *Images  captured and stored on drive  Diagnosis / Impression:  OD: Very poor image, no IRF/SRF -- foveal contour poorly visible appears normal contour OS: Interval improvement in IRF/IRHM greatest SN; focal subfoveal SRF -- stably improved  Clinical management:  See below  Abbreviations: NFP - Normal foveal profile. CME - cystoid macular edema. PED - pigment epithelial detachment. IRF - intraretinal fluid. SRF - subretinal fluid. EZ - ellipsoid zone. ERM - epiretinal membrane. ORA - outer retinal atrophy. ORT - outer retinal tubulation. SRHM - subretinal hyper-reflective material. IRHM - intraretinal hyper-reflective material      Intravitreal Injection, Pharmacologic Agent - OS - Left Eye       Time Out 04/23/2023. 10:45 AM. Confirmed correct patient, procedure, site, and patient consented.   Anesthesia Topical anesthesia was used. Anesthetic medications included Lidocaine 4%, Proparacaine 0.5%.   Procedure Preparation included 5% betadine to ocular surface, eyelid speculum. A (32g) needle was used.   Injection: 1.25 mg Bevacizumab 1.25mg /0.53ml   Route: Intravitreal, Site: Left Eye   NDC: [id number], Lot: [id number], Expiration date: 02/05/2024   Post-op Post injection exam found visual acuity of at least counting fingers. The patient tolerated the procedure well. There were no complications. The patient received written and verbal post procedure care education.              ASSESSMENT/PLAN:    ICD-10-CM   1. 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 Essential  hypertension  I10     3. Hypertensive retinopathy of both eyes  H35.033     4. History of retinal tear  Z86.69     5. 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 Combined forms of age-related cataract of both eyes  H25.813      BRVO w/ CME OS - pt reports spikes in BP as adjustments have been made with thyroid and BP medications  - pt reports SBP max of 190s several times - FA 03.12.24 shows mild perivascular leakage SN macula - s/p IVA OS #1 (03.12.24), #2 (04.09.24), #3 (05.07.24), #4 (06.04.24) - BCVA 20/30 - exam shows small cluster of IRH and exudates SN macula improving - OCT shows Interval improvement in IRF/IRHM greatest SN; focal subfoveal SRF -- stably improved at 4 weeks - recommend IVA OS #5 today, 07.09.24 with follow up extended to 5 weeks - RBA of procedure discussed, questions answered - IVA informed consent obtained and signed, 03.12.24 - see procedure note - F/U 5 weeks -- DFE/OCT/possible injection  2,3. Hypertensive retinopathy OU - discussed importance of tight BP control and relation to #1 above - monitor  4. Hx of retinal tears OU  - s/p laser retinopexy with Dr. Ashley Royalty (Aug 2015; OD 0800, OS 0300)  - stable  - no new RT/RD  5. 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 [name] for further evaluation and management -- appt in July - pt saw Dr. [name] on 04.16.24 for evaluation and management of uveitis / ocular inflammation - work up initiated by Dr. [name] and pt was started on Cellcept 500 mg BID   6. Mixed Cataract OU - The symptoms of cataract, surgical options, and treatments and risks were discussed  with patient. - discussed diagnosis and progression - likely visually significant - under the expert management of Dr. [name] and [facility]  7. Ocular hypertension - IOP today: 22, 20 - Brimonidine BID OU   Ophthalmic Meds Ordered this visit:  Meds ordered  this encounter  Medications   Bevacizumab (AVASTIN) SOLN 1.25 mg     Return in about 5 weeks (around 05/28/2023) for BRVO OS , DFE, OCT, IVA, OS.  There are no Patient Instructions on file for this visit.   Explained the diagnoses, plan, and follow up with the patient and they expressed understanding.  Patient expressed understanding of the importance of proper follow up care.   This document serves as a record of services personally performed by Karie Chimera, MD, PhD. It was created on their behalf by Gerilyn Nestle, COT an ophthalmic technician. The creation of this record is the provider's dictation and/or activities during the visit.    Electronically signed by:  Charlette Caffey, COT  04/27/23 7:30 PM  Karie Chimera, M.D., Ph.D. Diseases & Surgery of the Retina and Vitreous [facility]  I have reviewed the above documentation for accuracy and completeness, and I agree with the above. Karie Chimera, M.D., Ph.D. 04/27/23 7:32 PM   Abbreviations: M myopia (nearsighted); A astigmatism; H hyperopia (farsighted); P presbyopia; Mrx spectacle prescription;  CTL contact lenses; OD right eye; OS left eye; OU both eyes  XT exotropia; ET esotropia; PEK punctate epithelial keratitis; PEE punctate epithelial erosions; DES dry eye syndrome; MGD meibomian gland dysfunction; ATs artificial tears; PFAT's preservative free artificial tears; NSC nuclear sclerotic cataract; PSC posterior subcapsular cataract; ERM epi-retinal membrane; PVD posterior vitreous detachment; RD retinal detachment; DM diabetes mellitus; DR diabetic retinopathy; NPDR non-proliferative diabetic retinopathy; PDR proliferative diabetic retinopathy; CSME clinically significant macular edema; DME diabetic macular edema; dbh dot blot hemorrhages; CWS cotton wool spot; POAG primary open angle glaucoma; C/D cup-to-disc ratio; HVF humphrey visual field; GVF goldmann visual field; OCT optical coherence  tomography; IOP intraocular pressure; BRVO Branch retinal vein occlusion; CRVO central retinal vein occlusion; CRAO central retinal artery occlusion; BRAO branch retinal artery occlusion; RT retinal tear; SB scleral buckle; PPV pars plana vitrectomy; VH Vitreous hemorrhage; PRP panretinal laser photocoagulation; IVK intravitreal kenalog; VMT vitreomacular traction; MH Macular hole;  NVD neovascularization of the disc; NVE neovascularization elsewhere; AREDS age related eye disease study; ARMD age related macular degeneration; POAG primary open angle glaucoma; EBMD epithelial/anterior basement membrane dystrophy; ACIOL anterior chamber intraocular lens; IOL intraocular lens; PCIOL posterior chamber intraocular lens; Phaco/IOL phacoemulsification with intraocular lens placement; PRK photorefractive keratectomy; LASIK laser assisted in situ keratomileusis; HTN hypertension; DM diabetes mellitus; COPD chronic obstructive pulmonary disease

## 2023-04-23 ENCOUNTER — Encounter (INDEPENDENT_AMBULATORY_CARE_PROVIDER_SITE_OTHER): Payer: Self-pay | Admitting: Ophthalmology

## 2023-04-23 ENCOUNTER — Ambulatory Visit (INDEPENDENT_AMBULATORY_CARE_PROVIDER_SITE_OTHER): Payer: Medicare PPO | Admitting: Ophthalmology

## 2023-04-23 DIAGNOSIS — I10 Essential (primary) hypertension: Secondary | ICD-10-CM | POA: Diagnosis not present

## 2023-04-23 DIAGNOSIS — H179 Unspecified corneal scar and opacity: Secondary | ICD-10-CM

## 2023-04-23 DIAGNOSIS — Z8669 Personal history of other diseases of the nervous system and sense organs: Secondary | ICD-10-CM

## 2023-04-23 DIAGNOSIS — H25813 Combined forms of age-related cataract, bilateral: Secondary | ICD-10-CM

## 2023-04-23 DIAGNOSIS — H34832 Tributary (branch) retinal vein occlusion, left eye, with macular edema: Secondary | ICD-10-CM

## 2023-04-23 DIAGNOSIS — H35033 Hypertensive retinopathy, bilateral: Secondary | ICD-10-CM

## 2023-04-23 MED ORDER — BEVACIZUMAB CHEMO INJECTION 1.25MG/0.05ML SYRINGE FOR KALEIDOSCOPE
1.25 mg | INTRAVITREAL | Status: AC | PRN
Start: 2023-04-23 — End: 2023-04-23
  Administered 2023-04-23: 1.25 mg via INTRAVITREAL

## 2023-05-14 NOTE — Progress Notes (Signed)
[facility] - Clinic Note  05/28/2023     CHIEF COMPLAINT Patient presents for Retina Follow Up   HISTORY OF PRESENT ILLNESS: Courtney Whitaker is a 67 y.o. female who presents to the clinic today for:   HPI     Retina Follow Up   Patient presents with  CRVO/BRVO.  In left eye.  This started 5 weeks ago.  Duration of 5 weeks.  Since onset it is stable.  I, the attending physician,  performed the HPI with the patient and updated documentation appropriately.        Comments   5 week retina follow up BRVO OS IVA OS pt is reporting vision seems not as sharp she has some floaters but denies any flashes she is using dorz OD bid some times ou brim os qhs      Last edited by [name], MD on 05/28/2023 12:39 PM.    Pt is scheduled to have corneal transplant sx OD with Dr. [name]   Referring physician: No referring provider defined for this encounter.  HISTORICAL INFORMATION:   Selected notes from the MEDICAL RECORD NUMBER Referred by Dr. [name] for CSR LEE:  Ocular Hx- PMH-    CURRENT MEDICATIONS: Current Outpatient Medications (Ophthalmic Drugs)  Medication Sig   brimonidine (ALPHAGAN) 0.2 % ophthalmic solution Place 1 drop into both eyes 2 (two) times daily.   DUREZOL 0.05 % EMUL Place 1 drop into the right eye daily.   No current facility-administered medications for this visit. (Ophthalmic Drugs)   Current Outpatient Medications (Other)  Medication Sig   aspirin EC 81 MG tablet Take 81 mg by mouth daily.   citalopram (CELEXA) 40 MG tablet Take 40 mg by mouth daily.   cyanocobalamin (VITAMIN B12) 1000 MCG tablet Take 1,000 mcg by mouth daily. (Patient not taking: Reported on 02/19/2023)   Glucosamine-Chondroitin 500-400 MG CAPS Take 1 tablet by mouth daily.   levothyroxine (SYNTHROID) 25 MCG tablet Take 25 mcg by mouth daily before breakfast.   losartan (COZAAR) 50 MG tablet Take 50 mg by mouth daily.   Magnesium Gluconate 27.5 MG TABS Take 1  tablet by mouth daily. (Patient not taking: Reported on 02/19/2023)   mesalamine (LIALDA) 1.2 g EC tablet Take 1.2 g by mouth daily with breakfast.   metoprolol succinate (TOPROL-XL) 25 MG 24 hr tablet Take 12.5 mg by mouth daily.   mycophenolate (CELLCEPT) 500 MG tablet Take 500 mg by mouth 2 (two) times daily.   Omega 3 1000 MG CAPS Take 1 capsule by mouth daily.   valACYclovir (VALTREX) 500 MG tablet Take 500 mg by mouth 2 (two) times daily.   No current facility-administered medications for this visit. (Other)   REVIEW OF SYSTEMS: ROS   Positive for: Gastrointestinal, Endocrine, Cardiovascular, Eyes, Psychiatric Negative for: Constitutional, Neurological, Skin, Genitourinary, Musculoskeletal, HENT, Respiratory, Allergic/Imm, Heme/Lymph Last edited by Etheleen Mayhew, COT on 05/28/2023  9:17 AM.     ALLERGIES Allergies  Allergen Reactions   Penicillins    PAST MEDICAL HISTORY Past Medical History:  Diagnosis Date   Depression    Hypertension    Thyroid disease    Ulcerative colitis (HCC)    Past Surgical History:  Procedure Laterality Date   APPENDECTOMY     2023    FAMILY HISTORY Family History  Problem Relation Age of Onset   Diabetes Father    SOCIAL HISTORY Social History   Tobacco Use   Smoking status: Never  Smokeless tobacco: Never  Substance Use Topics   Alcohol use: Yes    Comment: Occasional, social   Drug use: Never       OPHTHALMIC EXAM:  Base Eye Exam     Visual Acuity (Snellen - Linear)       Right Left   Dist [place] 20/250 20/60   Dist ph [place] NI 20/50 -2         Tonometry (Tonopen, 9:23 AM)       Right Left   Pressure 19 17         Pupils       Pupils Dark Light Shape React APD   Right PERRL 3 2 Round Brisk None   Left PERRL 3 2 Round Brisk None         Visual Fields       Left Right    Full Full         Extraocular Movement       Right Left    Full, Ortho Full, Ortho         Neuro/Psych      Oriented x3: Yes   Mood/Affect: Normal         Dilation     Both eyes: 2.5% Phenylephrine @ 9:23 AM           Slit Lamp and Fundus Exam     Slit Lamp Exam       Right Left   Lids/Lashes Normal Dermatochalasis - upper lid   Conjunctiva/Sclera mild melanosis mild melanosis   Cornea Arcus, central corneal scar with central haze and mild thinning arcus, trace PEE, fine endo pigment   Anterior Chamber deep deep and clear, no cell or flare   Iris Round and dilated Dilated, PS at 0600   Lens 2-3+ Nuclear sclerosis, 2-3+ Cortical cataract, 3+ Posterior subcapsular cataract 2-3+ Nuclear sclerosis, 3+ Cortical cataract   Anterior Vitreous Vitreous syneresis, Posterior vitreous detachment, vitreous condensations          Fundus Exam       Right Left   Disc hazy view, sharp rim, mild pallor mild Pallor, Sharp rim, +cupping   C/D Ratio 0.6 0.7   Macula very hazy view, grossly flat, RPE mottling, no obvious heme Flat, good foveal reflex, RPE mottling, cluster of IRH and edema SN macula -- improved, punctate exudates SN fovea -- improving   Vessels hazy view, attenuated, Tortuous attenuated, Tortuous, no sheathing   Periphery Very hazy view, grossly attached, no details visible Attached, HST with good laser surrounding at 0300           IMAGING AND PROCEDURES  Imaging and Procedures for 05/28/2023  OCT, Retina - OU - Both Eyes       Right Eye Quality was poor. Central Foveal Thickness: 917. Progression has been stable. Findings include no IRF, no SRF (Very poor image, no IRF/SRF -- foveal contour poorly visible).   Left Eye Quality was good. Central Foveal Thickness: 239. Progression has improved. Findings include normal foveal contour, no IRF, no SRF, intraretinal hyper-reflective material (Interval improvement in IRF/IRHM greatest SN; focal subfoveal SRF -- stably improved).   Notes *Images captured and stored on drive  Diagnosis / Impression:  OD: Very poor image, no  IRF/SRF -- foveal contour poorly visible  OS: Interval improvement in IRF/IRHM greatest SN; focal subfoveal SRF -- stably improved  Clinical management:  See below  Abbreviations: NFP - Normal foveal profile. CME - cystoid macular edema. PED - pigment epithelial  detachment. IRF - intraretinal fluid. SRF - subretinal fluid. EZ - ellipsoid zone. ERM - epiretinal membrane. ORA - outer retinal atrophy. ORT - outer retinal tubulation. SRHM - subretinal hyper-reflective material. IRHM - intraretinal hyper-reflective material      Intravitreal Injection, Pharmacologic Agent - OS - Left Eye       Time Out 05/28/2023. 10:04 AM. Confirmed correct patient, procedure, site, and patient consented.   Anesthesia Topical anesthesia was used. Anesthetic medications included Lidocaine 4%, Proparacaine 0.5%.   Procedure Preparation included 5% betadine to ocular surface, eyelid speculum. A (32g) needle was used.   Injection: 1.25 mg Bevacizumab 1.25mg /0.82ml   Route: Intravitreal, Site: Left Eye   NDC: [id number], Lot: [id number], Expiration date: 02/05/2024   Post-op Post injection exam found visual acuity of at least counting fingers. The patient tolerated the procedure well. There were no complications. The patient received written and verbal post procedure care education.            ASSESSMENT/PLAN:    ICD-10-CM   1. 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 Essential hypertension  I10     3. Hypertensive retinopathy of both eyes  H35.033     4. History of retinal tear  Z86.69     5. 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 Combined forms of age-related cataract of both eyes  H25.813     9. Bilateral ocular hypertension  H40.053      BRVO  w/ CME OS - pt reports spikes in BP as adjustments have been made with thyroid and BP medications  - pt reports SBP max of 190s several times - FA 03.12.24 shows mild perivascular leakage SN macula - s/p IVA OS #1 (03.12.24), #2 (04.09.24), #3 (05.07.24), #4 (06.04.24), #5 (07.09.24) - BCVA decreased to 20/50 from 20/30 - exam shows small cluster of IRH and exudates SN macula improving - OCT shows Interval improvement in IRF/IRHM greatest SN; focal subfoveal SRF -- stably improved at 5 weeks - recommend IVA OS #6 today, 08.13.24 with follow up at 5 weeks again - RBA of procedure discussed, questions answered - IVA informed consent obtained and signed, 03.12.24 - see procedure note - F/U 5 weeks -- DFE/OCT/possible injection  2,3. Hypertensive retinopathy OU - discussed importance of tight BP control and relation to #1 above - monitor  4. Hx of retinal tears OU  - s/p laser retinopexy with Dr. Ashley Royalty (Aug 2015; OD 0800, OS 0300)  - stable  - no new RT/RD  5-7. 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 [name] for further evaluation and management -- appt in July - pt saw Dr. [name] on 04.16.24 for evaluation and management of uveitis / ocular inflammation - work up initiated by Dr. [name] and pt was started on Cellcept 500 mg BID - pt saw Dr. Reece Agar on 07.10.24 and scheduled for PK OD on 09.12.24  - Cellcept d/c'd and Valtrex continued per Dr. Reece Agar  8. Mixed Cataract OU - The symptoms of cataract, surgical options, and treatments and risks were discussed with patient. - discussed diagnosis and progression - likely visually significant -  under the expert management of Dr. [name] and [facility]  9. Ocular hypertension - IOP today: 19,17 - Brimonidine BID OU   Ophthalmic Meds Ordered this visit:  Meds ordered this encounter  Medications   Bevacizumab (AVASTIN) SOLN 1.25 mg     Return in about 5 weeks (around 07/02/2023) for f/u BRVO  OS, DFE, OCT.  There are no Patient Instructions on file for this visit.   Explained the diagnoses, plan, and follow up with the patient and they expressed understanding.  Patient expressed understanding of the importance of proper follow up care.   This document serves as a record of services personally performed by Karie Chimera, MD, PhD. It was created on their behalf by Gerilyn Nestle, COT an ophthalmic technician. The creation of this record is the provider's dictation and/or activities during the visit.    Electronically signed by:  Charlette Caffey, COT  05/28/23 12:44 PM  This document serves as a record of services personally performed by Karie Chimera, MD, PhD. It was created on their behalf by Glee Arvin. Manson Passey, OA an ophthalmic technician. The creation of this record is the provider's dictation and/or activities during the visit.    Electronically signed by: Glee Arvin. Manson Passey, OA 05/28/23 12:44 PM   Karie Chimera, M.D., Ph.D. Diseases & Surgery of the Retina and Vitreous [facility]  I have reviewed the above documentation for accuracy and completeness, and I agree with the above. Karie Chimera, M.D., Ph.D. 05/28/23 12:44 PM   Abbreviations: M myopia (nearsighted); A astigmatism; H hyperopia (farsighted); P presbyopia; Mrx spectacle prescription;  CTL contact lenses; OD right eye; OS left eye; OU both eyes  XT exotropia; ET esotropia; PEK punctate epithelial keratitis; PEE punctate epithelial erosions; DES dry eye syndrome; MGD meibomian gland dysfunction; ATs artificial tears; PFAT's preservative free artificial tears; NSC nuclear sclerotic cataract; PSC posterior subcapsular cataract; ERM epi-retinal membrane; PVD posterior vitreous detachment; RD retinal detachment; DM diabetes mellitus; DR diabetic retinopathy; NPDR non-proliferative diabetic retinopathy; PDR proliferative diabetic retinopathy; CSME clinically significant macular edema; DME  diabetic macular edema; dbh dot blot hemorrhages; CWS cotton wool spot; POAG primary open angle glaucoma; C/D cup-to-disc ratio; HVF humphrey visual field; GVF goldmann visual field; OCT optical coherence tomography; IOP intraocular pressure; BRVO Branch retinal vein occlusion; CRVO central retinal vein occlusion; CRAO central retinal artery occlusion; BRAO branch retinal artery occlusion; RT retinal tear; SB scleral buckle; PPV pars plana vitrectomy; VH Vitreous hemorrhage; PRP panretinal laser photocoagulation; IVK intravitreal kenalog; VMT vitreomacular traction; MH Macular hole;  NVD neovascularization of the disc; NVE neovascularization elsewhere; AREDS age related eye disease study; ARMD age related macular degeneration; POAG primary open angle glaucoma; EBMD epithelial/anterior basement membrane dystrophy; ACIOL anterior chamber intraocular lens; IOL intraocular lens; PCIOL posterior chamber intraocular lens; Phaco/IOL phacoemulsification with intraocular lens placement; PRK photorefractive keratectomy; LASIK laser assisted in situ keratomileusis; HTN hypertension; DM diabetes mellitus; COPD chronic obstructive pulmonary disease

## 2023-05-28 ENCOUNTER — Encounter (INDEPENDENT_AMBULATORY_CARE_PROVIDER_SITE_OTHER): Payer: Self-pay | Admitting: Ophthalmology

## 2023-05-28 ENCOUNTER — Ambulatory Visit (INDEPENDENT_AMBULATORY_CARE_PROVIDER_SITE_OTHER): Payer: Medicare PPO | Admitting: Ophthalmology

## 2023-05-28 DIAGNOSIS — H35033 Hypertensive retinopathy, bilateral: Secondary | ICD-10-CM | POA: Diagnosis not present

## 2023-05-28 DIAGNOSIS — Z8669 Personal history of other diseases of the nervous system and sense organs: Secondary | ICD-10-CM | POA: Diagnosis not present

## 2023-05-28 DIAGNOSIS — H40053 Ocular hypertension, bilateral: Secondary | ICD-10-CM

## 2023-05-28 DIAGNOSIS — I10 Essential (primary) hypertension: Secondary | ICD-10-CM

## 2023-05-28 DIAGNOSIS — B0050 Herpesviral ocular disease, unspecified: Secondary | ICD-10-CM

## 2023-05-28 DIAGNOSIS — H25813 Combined forms of age-related cataract, bilateral: Secondary | ICD-10-CM

## 2023-05-28 DIAGNOSIS — H179 Unspecified corneal scar and opacity: Secondary | ICD-10-CM

## 2023-05-28 DIAGNOSIS — H34832 Tributary (branch) retinal vein occlusion, left eye, with macular edema: Secondary | ICD-10-CM

## 2023-05-28 MED ORDER — BEVACIZUMAB CHEMO INJECTION 1.25MG/0.05ML SYRINGE FOR KALEIDOSCOPE
1.2500 mg | INTRAVITREAL | Status: AC | PRN
Start: 2023-05-28 — End: 2023-05-28
  Administered 2023-05-28: 1.25 mg via INTRAVITREAL

## 2023-06-18 NOTE — Progress Notes (Signed)
[facility] - Clinic Note  07/02/2023     CHIEF COMPLAINT Patient presents for Retina Follow Up   HISTORY OF PRESENT ILLNESS: Courtney Whitaker is a 67 y.o. female who presents to the clinic today for:   HPI     Retina Follow Up   Patient presents with  CRVO/BRVO.  In left eye.  This started 5 weeks ago.  Duration of 5 weeks.  Since onset it is stable.  I, the attending physician,  performed the HPI with the patient and updated documentation appropriately.        Comments   5 week retina follow up BRVO OS and IVA OS pt is reports that her vision get blurred in her OS after reading she close her right eye to read she denies flashes does have some floaters pt is using brim using mostly is OS  time per day she is using durezol OD only but has been out for a few weeks       Last edited by [name], MD on 07/02/2023 12:51 PM.    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  HISTORICAL INFORMATION:   Selected notes from the MEDICAL RECORD NUMBER Referred by Dr. [name] for CSR LEE:  Ocular Hx- PMH-    CURRENT MEDICATIONS: Current Outpatient Medications (Ophthalmic Drugs)  Medication Sig   brimonidine (ALPHAGAN) 0.2 % ophthalmic solution Place 1 drop into both eyes 2 (two) times daily.   DUREZOL 0.05 % EMUL Place 1 drop into the right eye daily.   No current facility-administered medications for this visit. (Ophthalmic Drugs)   Current Outpatient Medications (Other)  Medication Sig   aspirin EC 81 MG tablet Take 81 mg by mouth daily.   citalopram (CELEXA) 40 MG tablet Take 40 mg by mouth daily.   cyanocobalamin (VITAMIN B12) 1000 MCG tablet Take 1,000 mcg by mouth daily. (Patient not taking: Reported on 02/19/2023)   Glucosamine-Chondroitin 500-400 MG CAPS Take 1 tablet by mouth daily.   levothyroxine (SYNTHROID) 25 MCG tablet Take 25 mcg by mouth daily before breakfast.    losartan (COZAAR) 50 MG tablet Take 50 mg by mouth daily.   Magnesium Gluconate 27.5 MG TABS Take 1 tablet by mouth daily. (Patient not taking: Reported on 02/19/2023)   mesalamine (LIALDA) 1.2 g EC tablet Take 1.2 g by mouth daily with breakfast.   metoprolol succinate (TOPROL-XL) 25 MG 24 hr tablet Take 12.5 mg by mouth daily.   mycophenolate (CELLCEPT) 500 MG tablet Take 500 mg by mouth 2 (two) times daily.   Omega 3 1000 MG CAPS Take 1 capsule by mouth daily.   valACYclovir (VALTREX) 500 MG tablet Take 500 mg by mouth 2 (two) times daily.   No current facility-administered medications for this visit. (Other)   REVIEW OF SYSTEMS: ROS   Positive for: Gastrointestinal, Endocrine, Cardiovascular, Eyes, Psychiatric Negative for: Constitutional, Neurological, Skin, Genitourinary, Musculoskeletal, HENT, Respiratory, Allergic/Imm, Heme/Lymph Last edited by Etheleen Mayhew, COT on 07/02/2023  9:46 AM.      ALLERGIES Allergies  Allergen Reactions   Penicillins    PAST MEDICAL HISTORY Past Medical History:  Diagnosis Date   Depression    Hypertension    Thyroid disease    Ulcerative colitis (HCC)    Past Surgical History:  Procedure Laterality Date   APPENDECTOMY     2023    FAMILY HISTORY Family History  Problem Relation Age  of Onset   Diabetes Father    SOCIAL HISTORY Social History   Tobacco Use   Smoking status: Never   Smokeless tobacco: Never  Substance Use Topics   Alcohol use: Yes    Comment: Occasional, social   Drug use: Never       OPHTHALMIC EXAM:  Base Eye Exam     Visual Acuity (Snellen - Linear)       Right Left   Dist [place] CF at 3' 20/40 -2   Dist ph [place] 20/150 NI         Tonometry (Tonopen, 9:59 AM)       Right Left   Pressure 18 18         Pupils       Pupils Dark Light Shape React APD   Right PERRL 3 2 Round Brisk None   Left PERRL 3 2 Round Brisk None         Visual Fields       Left Right    Full Full          Extraocular Movement       Right Left    Full, Ortho Full, Ortho         Neuro/Psych     Oriented x3: Yes   Mood/Affect: Normal         Dilation     Both eyes: 2.5% Phenylephrine @ 9:59 AM           Slit Lamp and Fundus Exam     Slit Lamp Exam       Right Left   Lids/Lashes Normal Dermatochalasis - upper lid   Conjunctiva/Sclera mild melanosis mild melanosis   Cornea Arcus, central corneal scar with central haze and mild thinning arcus, trace PEE, fine endo pigment   Anterior Chamber deep deep and clear, no cell or flare   Iris Round and dilated Dilated, PS at 0600   Lens 2-3+ Nuclear sclerosis, 2-3+ Cortical cataract, 3+ Posterior subcapsular cataract 2-3+ Nuclear sclerosis, 3+ Cortical cataract   Anterior Vitreous Vitreous syneresis, Posterior vitreous detachment, vitreous condensations          Fundus Exam       Right Left   Disc hazy view, sharp rim, mild pallor mild Pallor, Sharp rim, +cupping   C/D Ratio 0.6 0.7   Macula very hazy view, grossly flat, RPE mottling, no obvious heme Flat, good foveal reflex, RPE mottling, cluster of IRH and edema SN macula -- improved, punctate exudates SN fovea -- improved   Vessels hazy view, attenuated, Tortuous attenuated, Tortuous, no sheathing   Periphery Very hazy view, grossly attached, no details visible Attached, HST with good laser surrounding at 0300, no new RT/RD, No heme           IMAGING AND PROCEDURES  Imaging and Procedures for 07/02/2023  OCT, Retina - OU - Both Eyes       Right Eye Quality was poor. Progression has been stable. Findings include normal foveal contour, no IRF, no SRF (Very poor image, no IRF/SRF ).   Left Eye Quality was good. Central Foveal Thickness: 241. Progression has improved. Findings include normal foveal contour, no IRF, no SRF, intraretinal hyper-reflective material (Interval improvement in IRF/IRHM greatest SN; focal subfoveal SRF -- stably improved).   Notes *Images  captured and stored on drive  Diagnosis / Impression:  OD: Very poor image, no IRF/SRF  OS: Interval improvement in IRF/IRHM greatest SN; focal subfoveal SRF -- stably improved  Clinical management:  See below  Abbreviations: NFP - Normal foveal profile. CME - cystoid macular edema. PED - pigment epithelial detachment. IRF - intraretinal fluid. SRF - subretinal fluid. EZ - ellipsoid zone. ERM - epiretinal membrane. ORA - outer retinal atrophy. ORT - outer retinal tubulation. SRHM - subretinal hyper-reflective material. IRHM - intraretinal hyper-reflective material      Intravitreal Injection, Pharmacologic Agent - OS - Left Eye       Time Out 07/02/2023. 10:52 AM. Confirmed correct patient, procedure, site, and patient consented.   Anesthesia Topical anesthesia was used. Anesthetic medications included Lidocaine 4%, Proparacaine 0.5%.   Procedure Preparation included 5% betadine to ocular surface, eyelid speculum. A supplied (32g) needle was used.   Injection: 1.25 mg Bevacizumab 1.25mg /0.29ml   Route: Intravitreal, Site: Left Eye   NDC: [id number], Lot: [id number] , Expiration date: 07/26/2023   Post-op Post injection exam found visual acuity of at least counting fingers. The patient tolerated the procedure well. There were no complications. The patient received written and verbal post procedure care education.            ASSESSMENT/PLAN:    ICD-10-CM   1. 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 Essential hypertension  I10     3. Hypertensive retinopathy of both eyes  H35.033     4. History of retinal tear  Z86.69     5. 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 Combined forms of age-related cataract of both eyes   H25.813     9. Bilateral ocular hypertension  H40.053      BRVO w/ CME OS - pt reports spikes in BP as adjustments have been made with thyroid and BP medications  - pt reports SBP max of 190s several times - FA 03.12.24 shows mild perivascular leakage SN macula - s/p IVA OS #1 (03.12.24), #2 (04.09.24), #3 (05.07.24), #4 (06.04.24), #5 (07.09.24), #6 (08.13.24) - BCVA improved to 20/40 from 20/50 - exam shows small cluster of IRH and exudates SN macula improving - OCT shows Interval improvement in IRF/IRHM greatest SN; focal subfoveal SRF -- stably improved at 5 weeks - recommend IVA OS #7 today, 09.17.24 with follow up extended to 6 weeks - RBA of procedure discussed, questions answered - IVA informed consent obtained and signed, 03.12.24 - see procedure note - F/U 6 weeks -- DFE/OCT/possible injection  2,3. Hypertensive retinopathy OU - discussed importance of tight BP control and relation to #1 above - monitor  4. Hx of retinal tears OU  - s/p laser retinopexy with Dr. Ashley Royalty (Aug 2015; OD 0800, OS 0300)  - stable  - no new RT/RD  5-7. 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 [name] - pt saw Dr. [name] on 04.16.24 for evaluation and management of uveitis / ocular inflammation - work up initiated by Dr. [name] and pt was started on Cellcept 500 mg BID - pt saw Dr. Reece Agar on 07.10.24 and scheduled for PK OD on 10.31.24  - Cellcept d/c'd and Valtrex continued per Dr. Reece Agar  8. Mixed Cataract OU - The symptoms of cataract, surgical options,  and treatments and risks were discussed with patient. - discussed diagnosis and progression - likely visually significant - under the expert management of Dr. [name] and [facility]  9. Ocular hypertension - IOP today: 18 OU - Brimonidine BID OU   Ophthalmic Meds Ordered this visit:  Meds ordered this encounter  Medications   Bevacizumab (AVASTIN) SOLN 1.25 mg      Return in about 6 weeks (around 08/13/2023) for f/u BRVO OS, DFE, OCT.  There are no Patient Instructions on file for this visit.   Explained the diagnoses, plan, and follow up with the patient and they expressed understanding.  Patient expressed understanding of the importance of proper follow up care.   This document serves as a record of services personally performed by Karie Chimera, MD, PhD. It was created on their behalf by Laurey Morale, COT an ophthalmic technician. The creation of this record is the provider's dictation and/or activities during the visit.    Electronically signed by:  Charlette Caffey, COT  07/04/23 4:53 PM  This document serves as a record of services personally performed by Karie Chimera, MD, PhD. It was created on their behalf by Glee Arvin. Manson Passey, OA an ophthalmic technician. The creation of this record is the provider's dictation and/or activities during the visit.    Electronically signed by: Glee Arvin. Manson Passey, OA 07/04/23 4:53 PM   Karie Chimera, M.D., Ph.D. Diseases & Surgery of the Retina and Vitreous [facility]  I have reviewed the above documentation for accuracy and completeness, and I agree with the above. Karie Chimera, M.D., Ph.D. 07/04/23 4:55 PM   Abbreviations: M myopia (nearsighted); A astigmatism; H hyperopia (farsighted); P presbyopia; Mrx spectacle prescription;  CTL contact lenses; OD right eye; OS left eye; OU both eyes  XT exotropia; ET esotropia; PEK punctate epithelial keratitis; PEE punctate epithelial erosions; DES dry eye syndrome; MGD meibomian gland dysfunction; ATs artificial tears; PFAT's preservative free artificial tears; NSC nuclear sclerotic cataract; PSC posterior subcapsular cataract; ERM epi-retinal membrane; PVD posterior vitreous detachment; RD retinal detachment; DM diabetes mellitus; DR diabetic retinopathy; NPDR non-proliferative diabetic retinopathy; PDR proliferative diabetic  retinopathy; CSME clinically significant macular edema; DME diabetic macular edema; dbh dot blot hemorrhages; CWS cotton wool spot; POAG primary open angle glaucoma; C/D cup-to-disc ratio; HVF humphrey visual field; GVF goldmann visual field; OCT optical coherence tomography; IOP intraocular pressure; BRVO Branch retinal vein occlusion; CRVO central retinal vein occlusion; CRAO central retinal artery occlusion; BRAO branch retinal artery occlusion; RT retinal tear; SB scleral buckle; PPV pars plana vitrectomy; VH Vitreous hemorrhage; PRP panretinal laser photocoagulation; IVK intravitreal kenalog; VMT vitreomacular traction; MH Macular hole;  NVD neovascularization of the disc; NVE neovascularization elsewhere; AREDS age related eye disease study; ARMD age related macular degeneration; POAG primary open angle glaucoma; EBMD epithelial/anterior basement membrane dystrophy; ACIOL anterior chamber intraocular lens; IOL intraocular lens; PCIOL posterior chamber intraocular lens; Phaco/IOL phacoemulsification with intraocular lens placement; PRK photorefractive keratectomy; LASIK laser assisted in situ keratomileusis; HTN hypertension; DM diabetes mellitus; COPD chronic obstructive pulmonary disease

## 2023-07-02 ENCOUNTER — Encounter (INDEPENDENT_AMBULATORY_CARE_PROVIDER_SITE_OTHER): Payer: Self-pay | Admitting: Ophthalmology

## 2023-07-02 ENCOUNTER — Ambulatory Visit (INDEPENDENT_AMBULATORY_CARE_PROVIDER_SITE_OTHER): Payer: Medicare PPO | Admitting: Ophthalmology

## 2023-07-02 DIAGNOSIS — H34832 Tributary (branch) retinal vein occlusion, left eye, with macular edema: Secondary | ICD-10-CM

## 2023-07-02 DIAGNOSIS — Z8669 Personal history of other diseases of the nervous system and sense organs: Secondary | ICD-10-CM

## 2023-07-02 DIAGNOSIS — I10 Essential (primary) hypertension: Secondary | ICD-10-CM | POA: Diagnosis not present

## 2023-07-02 DIAGNOSIS — B0050 Herpesviral ocular disease, unspecified: Secondary | ICD-10-CM

## 2023-07-02 DIAGNOSIS — H40053 Ocular hypertension, bilateral: Secondary | ICD-10-CM

## 2023-07-02 DIAGNOSIS — H179 Unspecified corneal scar and opacity: Secondary | ICD-10-CM

## 2023-07-02 DIAGNOSIS — H35033 Hypertensive retinopathy, bilateral: Secondary | ICD-10-CM | POA: Diagnosis not present

## 2023-07-02 DIAGNOSIS — H25813 Combined forms of age-related cataract, bilateral: Secondary | ICD-10-CM

## 2023-07-02 MED ORDER — BEVACIZUMAB CHEMO INJECTION 1.25MG/0.05ML SYRINGE FOR KALEIDOSCOPE
1.2500 mg | INTRAVITREAL | Status: AC | PRN
Start: 2023-07-02 — End: 2023-07-02
  Administered 2023-07-02: 1.25 mg via INTRAVITREAL

## 2023-07-31 NOTE — Progress Notes (Signed)
[facility] - Clinic Note  08/13/2023     CHIEF COMPLAINT Patient presents for Retina Follow Up   HISTORY OF PRESENT ILLNESS: Courtney Whitaker is a 67 y.o. female who presents to the clinic today for:   HPI     Retina Follow Up   Patient presents with  CRVO/BRVO.  In left eye.  This started months ago.  Duration of 6 weeks.  Since onset it is gradually worsening.  I, the attending physician,  performed the HPI with the patient and updated documentation appropriately.        Comments   Patient feels the vision is a little worse. The vision is getting cloudier. She is using Brimonidine OS BID.       Last edited by [name], MD on 08/13/2023 11:49 AM.    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 [name]  Referring physician: No referring provider defined for this encounter.  HISTORICAL INFORMATION:   Selected notes from the MEDICAL RECORD NUMBER Referred by Dr. [name] for CSR LEE:  Ocular Hx- PMH-    CURRENT MEDICATIONS: Current Outpatient Medications (Ophthalmic Drugs)  Medication Sig   brimonidine (ALPHAGAN) 0.2 % ophthalmic solution Place 1 drop into both eyes 2 (two) times daily.   DUREZOL 0.05 % EMUL Place 1 drop into the right eye daily.   No current facility-administered medications for this visit. (Ophthalmic Drugs)   Current Outpatient Medications (Other)  Medication Sig   aspirin EC 81 MG tablet Take 81 mg by mouth daily.   citalopram (CELEXA) 40 MG tablet Take 40 mg by mouth daily.   cyanocobalamin (VITAMIN B12) 1000 MCG tablet Take 1,000 mcg by mouth daily.   Glucosamine-Chondroitin 500-400 MG CAPS Take 1 tablet by mouth daily.   levothyroxine (SYNTHROID) 25 MCG tablet Take 25 mcg by mouth daily before breakfast.   losartan (COZAAR) 50 MG tablet Take 50 mg by mouth daily.   Magnesium Gluconate 27.5 MG TABS Take 1 tablet by mouth daily.   mesalamine (LIALDA) 1.2 g EC tablet Take 1.2 g by mouth daily with  breakfast.   metoprolol succinate (TOPROL-XL) 25 MG 24 hr tablet Take 12.5 mg by mouth daily.   mycophenolate (CELLCEPT) 500 MG tablet Take 500 mg by mouth 2 (two) times daily.   Omega 3 1000 MG CAPS Take 1 capsule by mouth daily.   valACYclovir (VALTREX) 500 MG tablet Take 500 mg by mouth 2 (two) times daily.   No current facility-administered medications for this visit. (Other)   REVIEW OF SYSTEMS: ROS   Positive for: Gastrointestinal, Endocrine, Cardiovascular, Eyes, Psychiatric Negative for: Constitutional, Neurological, Skin, Genitourinary, Musculoskeletal, HENT, Respiratory, Allergic/Imm, Heme/Lymph Last edited by Charlette Caffey, COT on 08/13/2023  9:12 AM.       ALLERGIES Allergies  Allergen Reactions   Penicillins    PAST MEDICAL HISTORY Past Medical History:  Diagnosis Date   Depression    Hypertension    Thyroid disease    Ulcerative colitis (HCC)    Past Surgical History:  Procedure Laterality Date   APPENDECTOMY     2023    FAMILY HISTORY Family History  Problem Relation Age of Onset   Diabetes Father    SOCIAL HISTORY Social History   Tobacco Use   Smoking status: Never   Smokeless tobacco: Never  Substance Use Topics   Alcohol use: Yes    Comment: Occasional, social   Drug use: Never  OPHTHALMIC EXAM:  Base Eye Exam     Visual Acuity (Snellen - Linear)       Right Left   Dist [place] CF at 3' 20/40   Dist ph [place] 20/200 20/25         Tonometry (Tonopen, 9:18 AM)       Right Left   Pressure 18 17         Pupils       Dark Light Shape React APD   Right 3 2 Round Brisk None   Left 3 2 Round Brisk None         Visual Fields       Left Right    Full Full         Extraocular Movement       Right Left    Full, Ortho Full, Ortho         Neuro/Psych     Oriented x3: Yes   Mood/Affect: Normal         Dilation     Both eyes: 1.0% Mydriacyl, 2.5% Phenylephrine @ 9:12 AM           Slit Lamp and  Fundus Exam     Slit Lamp Exam       Right Left   Lids/Lashes Normal Dermatochalasis - upper lid   Conjunctiva/Sclera mild melanosis mild melanosis   Cornea Arcus, central corneal scar with central haze and mild thinning arcus, trace PEE, fine endo pigment   Anterior Chamber deep deep and clear, no cell or flare   Iris Round and dilated Dilated, PS at 0600   Lens 2-3+ Nuclear sclerosis, 2-3+ Cortical cataract, 3+ Posterior subcapsular cataract 2-3+ Nuclear sclerosis, 3+ Cortical cataract   Anterior Vitreous Vitreous syneresis, Posterior vitreous detachment, vitreous condensations          Fundus Exam       Right Left   Disc hazy view, sharp rim, mild pallor mild Pallor, Sharp rim, +cupping   C/D Ratio 0.6 0.7   Macula very hazy view, grossly flat, RPE mottling, no obvious heme Flat, good foveal reflex, RPE mottling, cluster of IRH and edema SN macula -- improved, punctate exudates SN fovea -- improved   Vessels hazy view, attenuated, Tortuous attenuated, Tortuous, no sheathing   Periphery Very hazy view, grossly attached, no details visible Attached, HST with good laser surrounding at 0300, no new RT/RD, No heme           IMAGING AND PROCEDURES  Imaging and Procedures for 08/13/2023  OCT, Retina - OU - Both Eyes       Right Eye Quality was poor. Progression has been stable. Findings include normal foveal contour, no IRF, no SRF (Very poor image, no IRF/SRF ).   Left Eye Quality was good. Central Foveal Thickness: 245. Progression has improved. Findings include normal foveal contour, no IRF, no SRF, intraretinal hyper-reflective material (Stable improvement in IRF and mild interval improvement in [facility] greatest SN; focal subfoveal SRF -- stably improved).   Notes *Images captured and stored on drive  Diagnosis / Impression:  OD: Very poor image, no IRF/SRF  OS: Stable improvement in IRF and mild interval improvement in IRHM greatest SN; focal subfoveal SRF -- stably  improved  Clinical management:  See below  Abbreviations: NFP - Normal foveal profile. CME - cystoid macular edema. PED - pigment epithelial detachment. IRF - intraretinal fluid. SRF - subretinal fluid. EZ - ellipsoid zone. ERM - epiretinal membrane. ORA - outer retinal atrophy.  ORT - outer retinal tubulation. SRHM - subretinal hyper-reflective material. IRHM - intraretinal hyper-reflective material      Intravitreal Injection, Pharmacologic Agent - OS - Left Eye       Time Out 08/13/2023. 9:56 AM. Confirmed correct patient, procedure, site, and patient consented.   Anesthesia Topical anesthesia was used. Anesthetic medications included Lidocaine 4%, Proparacaine 0.5%.   Procedure Preparation included 5% betadine to ocular surface, eyelid speculum. A supplied (32g) needle was used.   Injection: 1.25 mg Bevacizumab 1.25mg /0.2ml   Route: Intravitreal, Site: Left Eye   NDC: [id number], Lot: [id number], Expiration date: 10/13/2023   Post-op Post injection exam found visual acuity of at least counting fingers. The patient tolerated the procedure well. There were no complications. The patient received written and verbal post procedure care education.             ASSESSMENT/PLAN:    ICD-10-CM   1. 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 Essential hypertension  I10     3. Hypertensive retinopathy of both eyes  H35.033     4. History of retinal tear  Z86.69     5. 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 Combined forms of age-related cataract of both eyes  H25.813     9. Bilateral ocular hypertension  H40.053       BRVO w/ CME OS - pt reports spikes in BP as adjustments have been made with thyroid and BP medications  - pt  reports SBP max of 190s several times - FA 03.12.24 shows mild perivascular leakage SN macula - s/p IVA OS #1 (03.12.24), #2 (04.09.24), #3 (05.07.24), #4 (06.04.24), #5 (07.09.24), #6 (08.13.24), #7 (09.17.24) - BCVA improved to 20/25 from 20/40 - exam shows small cluster of IRH and exudates SN macula improving - OCT shows Stable improvement in IRF and mild interval improvement in [facility] greatest SN; focal subfoveal SRF -- stably improved at 6 weeks - recommend IVA OS #8 today, 10.29.24 with follow up extended to 7 weeks - RBA of procedure discussed, questions answered - IVA informed consent obtained and signed, 03.12.24 - see procedure note - F/U 7 weeks -- DFE/OCT/possible injection  2,3. Hypertensive retinopathy OU - discussed importance of tight BP control and relation to #1 above - monitor  4. Hx of retinal tears OU  - s/p laser retinopexy with Dr. Ashley Royalty (Aug 2015; OD 0800, OS 0300)  - stable  - no new RT/RD  5-7. 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 [name] - pt saw Dr. [name] on 04.16.24 for evaluation and management of uveitis / ocular inflammation - work up initiated by Dr. [name] and pt was started on Cellcept 500 mg BID - pt saw Dr. Reece Agar on 07.10.24 and scheduled for PK OD on 10.31.24  - Cellcept d/c'd and Valtrex continued per Dr. Reece Agar  8. Mixed Cataract OU - The symptoms of cataract, surgical options, and treatments and risks were discussed with patient. - discussed diagnosis and progression - likely visually significant - under the expert management of Dr. [name] and [facility]  9. Ocular hypertension -  IOP today: 18, 17  - Brimonidine BID OU   Ophthalmic Meds Ordered this visit:  Meds ordered this encounter  Medications   Bevacizumab (AVASTIN) SOLN 1.25 mg     Return in about 7 weeks (around 10/01/2023) for f/u BRVO OS , DFE, OCT, Possible, IVA, OS.  There are no Patient Instructions on  file for this visit.   Explained the diagnoses, plan, and follow up with the patient and they expressed understanding.  Patient expressed understanding of the importance of proper follow up care.   This document serves as a record of services personally performed by Karie Chimera, MD, PhD. It was created on their behalf by Laurey Morale, COT an ophthalmic technician. The creation of this record is the provider's dictation and/or activities during the visit.    Electronically signed by:  Charlette Caffey, COT  08/13/23 11:50 AM  Karie Chimera, M.D., Ph.D. Diseases & Surgery of the Retina and Vitreous [facility]  I have reviewed the above documentation for accuracy and completeness, and I agree with the above. Karie Chimera, M.D., Ph.D. 08/13/23 11:51 AM   Abbreviations: M myopia (nearsighted); A astigmatism; H hyperopia (farsighted); P presbyopia; Mrx spectacle prescription;  CTL contact lenses; OD right eye; OS left eye; OU both eyes  XT exotropia; ET esotropia; PEK punctate epithelial keratitis; PEE punctate epithelial erosions; DES dry eye syndrome; MGD meibomian gland dysfunction; ATs artificial tears; PFAT's preservative free artificial tears; NSC nuclear sclerotic cataract; PSC posterior subcapsular cataract; ERM epi-retinal membrane; PVD posterior vitreous detachment; RD retinal detachment; DM diabetes mellitus; DR diabetic retinopathy; NPDR non-proliferative diabetic retinopathy; PDR proliferative diabetic retinopathy; CSME clinically significant macular edema; DME diabetic macular edema; dbh dot blot hemorrhages; CWS cotton wool spot; POAG primary open angle glaucoma; C/D cup-to-disc ratio; HVF humphrey visual field; GVF goldmann visual field; OCT optical coherence tomography; IOP intraocular pressure; BRVO Branch retinal vein occlusion; CRVO central retinal vein occlusion; CRAO central retinal artery occlusion; BRAO branch retinal artery occlusion; RT  retinal tear; SB scleral buckle; PPV pars plana vitrectomy; VH Vitreous hemorrhage; PRP panretinal laser photocoagulation; IVK intravitreal kenalog; VMT vitreomacular traction; MH Macular hole;  NVD neovascularization of the disc; NVE neovascularization elsewhere; AREDS age related eye disease study; ARMD age related macular degeneration; POAG primary open angle glaucoma; EBMD epithelial/anterior basement membrane dystrophy; ACIOL anterior chamber intraocular lens; IOL intraocular lens; PCIOL posterior chamber intraocular lens; Phaco/IOL phacoemulsification with intraocular lens placement; PRK photorefractive keratectomy; LASIK laser assisted in situ keratomileusis; HTN hypertension; DM diabetes mellitus; COPD chronic obstructive pulmonary disease

## 2023-08-13 ENCOUNTER — Ambulatory Visit (INDEPENDENT_AMBULATORY_CARE_PROVIDER_SITE_OTHER): Payer: Medicare PPO | Admitting: Ophthalmology

## 2023-08-13 ENCOUNTER — Encounter (INDEPENDENT_AMBULATORY_CARE_PROVIDER_SITE_OTHER): Payer: Self-pay | Admitting: Ophthalmology

## 2023-08-13 DIAGNOSIS — Z8669 Personal history of other diseases of the nervous system and sense organs: Secondary | ICD-10-CM

## 2023-08-13 DIAGNOSIS — H35033 Hypertensive retinopathy, bilateral: Secondary | ICD-10-CM | POA: Diagnosis not present

## 2023-08-13 DIAGNOSIS — I10 Essential (primary) hypertension: Secondary | ICD-10-CM

## 2023-08-13 DIAGNOSIS — H25813 Combined forms of age-related cataract, bilateral: Secondary | ICD-10-CM

## 2023-08-13 DIAGNOSIS — H34832 Tributary (branch) retinal vein occlusion, left eye, with macular edema: Secondary | ICD-10-CM

## 2023-08-13 DIAGNOSIS — H179 Unspecified corneal scar and opacity: Secondary | ICD-10-CM

## 2023-08-13 DIAGNOSIS — B0050 Herpesviral ocular disease, unspecified: Secondary | ICD-10-CM

## 2023-08-13 DIAGNOSIS — H40053 Ocular hypertension, bilateral: Secondary | ICD-10-CM

## 2023-08-13 MED ORDER — BEVACIZUMAB CHEMO INJECTION 1.25MG/0.05ML SYRINGE FOR KALEIDOSCOPE
1.2500 mg | INTRAVITREAL | Status: AC | PRN
Start: 2023-08-13 — End: 2023-08-13
  Administered 2023-08-13: 1.25 mg via INTRAVITREAL

## 2023-09-17 NOTE — Progress Notes (Signed)
[facility] - Clinic Note  10/01/2023     CHIEF COMPLAINT Patient presents for Retina Follow Up   HISTORY OF PRESENT ILLNESS: Courtney Whitaker is a 67 y.o. female who presents to the clinic today for:   HPI     Retina Follow Up   Patient presents with  CRVO/BRVO.  In left eye.  This started 7 weeks ago.  Duration of 7 weeks.  Since onset it is stable.  I, the attending physician,  performed the HPI with the patient and updated documentation appropriately.        Comments   7 week retina follow up BRVO OS pt is reporting blurred vision she is having some floaters but denies any flashes       Last edited by [name], MD on 10/01/2023 12:48 PM.    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 [name], but she cannot see anything, she states when she looks in the mirror she sees a white spot right on top of her pupil, which Dr. Reece Agar said was a cataract that he cannot remove until her stitches come out which is going to be about a year from now.  Referring physician: No referring provider defined for this encounter.  HISTORICAL INFORMATION:   Selected notes from the MEDICAL RECORD NUMBER Referred by Dr. [name] for CSR LEE:  Ocular Hx- PMH-    CURRENT MEDICATIONS: Current Outpatient Medications (Ophthalmic Drugs)  Medication Sig   brimonidine (ALPHAGAN) 0.2 % ophthalmic solution Place 1 drop into both eyes 2 (two) times daily.   DUREZOL 0.05 % EMUL Place 1 drop into the right eye daily.   No current facility-administered medications for this visit. (Ophthalmic Drugs)   Current Outpatient Medications (Other)  Medication Sig   aspirin EC 81 MG tablet Take 81 mg by mouth daily.   citalopram (CELEXA) 40 MG tablet Take 40 mg by mouth daily.   cyanocobalamin (VITAMIN B12) 1000 MCG tablet Take 1,000 mcg by mouth daily.   Glucosamine-Chondroitin 500-400 MG CAPS Take 1 tablet by mouth daily.   levothyroxine (SYNTHROID) 25 MCG tablet Take 25  mcg by mouth daily before breakfast.   losartan (COZAAR) 50 MG tablet Take 50 mg by mouth daily.   Magnesium Gluconate 27.5 MG TABS Take 1 tablet by mouth daily.   mesalamine (LIALDA) 1.2 g EC tablet Take 1.2 g by mouth daily with breakfast.   metoprolol succinate (TOPROL-XL) 25 MG 24 hr tablet Take 12.5 mg by mouth daily.   mycophenolate (CELLCEPT) 500 MG tablet Take 500 mg by mouth 2 (two) times daily.   Omega 3 1000 MG CAPS Take 1 capsule by mouth daily.   valACYclovir (VALTREX) 500 MG tablet Take 500 mg by mouth 2 (two) times daily.   No current facility-administered medications for this visit. (Other)   REVIEW OF SYSTEMS: ROS   Positive for: Gastrointestinal, Endocrine, Cardiovascular, Eyes, Psychiatric Negative for: Constitutional, Neurological, Skin, Genitourinary, Musculoskeletal, HENT, Respiratory, Allergic/Imm, Heme/Lymph Last edited by Etheleen Mayhew, COT on 10/01/2023  9:50 AM.     ALLERGIES Allergies  Allergen Reactions   Penicillins    PAST MEDICAL HISTORY Past Medical History:  Diagnosis Date   Depression    Hypertension    Thyroid disease    Ulcerative colitis (HCC)    Past Surgical History:  Procedure Laterality Date   APPENDECTOMY     2023    FAMILY HISTORY Family History  Problem Relation Age of  Onset   Diabetes Father    SOCIAL HISTORY Social History   Tobacco Use   Smoking status: Never   Smokeless tobacco: Never  Substance Use Topics   Alcohol use: Yes    Comment: Occasional, social   Drug use: Never       OPHTHALMIC EXAM:  Base Eye Exam     Visual Acuity (Snellen - Linear)       Right Left   Dist [place] LP 20/40 -1   Dist ph [place] 20/250 NI         Tonometry (Tonopen, 9:58 AM)       Right Left   Pressure 16 18         Pupils       Pupils Dark Light Shape React APD   Right PERRL 3 2 Round Brisk None   Left PERRL 3 2 Round Brisk None         Visual Fields       Left Right    Full Full          Extraocular Movement       Right Left    Full, Ortho Full, Ortho         Neuro/Psych     Oriented x3: Yes   Mood/Affect: Normal         Dilation     Both eyes: 2.5% Phenylephrine @ 9:58 AM           Slit Lamp and Fundus Exam     Slit Lamp Exam       Right Left   Lids/Lashes Dermatochalasis - upper lid Dermatochalasis - upper lid   Conjunctiva/Sclera melanosis mild melanosis   Cornea S/p PK with nylon sutures intact, graft clear centrally, +endo pigment arcus, trace PEE, fine endo pigment   Anterior Chamber deep deep and clear, no cell or flare   Iris Round and dilated Irregular dilation, PS at 0600   Lens White mature cataract 2-3+ Nuclear sclerosis, 3+ Cortical cataract   Anterior Vitreous Vitreous syneresis, Posterior vitreous detachment, vitreous condensations          Fundus Exam       Right Left   Disc No view mild Pallor, Sharp rim, +cupping   C/D Ratio  0.7   Macula No view Flat, good foveal reflex, RPE mottling, cluster of IRH and edema SN macula -- improved, punctate exudates SN fovea -- improved   Vessels No view attenuated, Tortuous, no sheathing   Periphery No view Attached, HST with good laser surrounding at 0300, no new RT/RD, No heme           IMAGING AND PROCEDURES  Imaging and Procedures for 10/01/2023  OCT, Retina - OU - Both Eyes       Right Eye Quality was poor. Findings include normal foveal contour, no IRF, no SRF (No interpretable images obtained).   Left Eye Quality was good. Central Foveal Thickness: 256. Progression has been stable. Findings include normal foveal contour, no IRF, no SRF, intraretinal hyper-reflective material (Stable improvement in IRF and IRHM; no SRF ).   Notes *Images captured and stored on drive  Diagnosis / Impression:  OD: No interpretable images obtained OS: Stable improvement in IRF and IRHM; no SRF   Clinical management:  See below  Abbreviations: NFP - Normal foveal profile. CME - cystoid  macular edema. PED - pigment epithelial detachment. IRF - intraretinal fluid. SRF - subretinal fluid. EZ - ellipsoid zone. ERM - epiretinal membrane. ORA -  outer retinal atrophy. ORT - outer retinal tubulation. SRHM - subretinal hyper-reflective material. IRHM - intraretinal hyper-reflective material      Intravitreal Injection, Pharmacologic Agent - OS - Left Eye       Time Out 10/01/2023. 10:57 AM. Confirmed correct patient, procedure, site, and patient consented.   Anesthesia Topical anesthesia was used. Anesthetic medications included Lidocaine 2%, Proparacaine 0.5%.   Procedure Preparation included 5% betadine to ocular surface, eyelid speculum. A (32g) needle was used.   Injection: 1.25 mg Bevacizumab 1.25mg /0.76ml   Route: Intravitreal, Site: Left Eye   NDC: P3213405, Lot: [id number], Expiration date: 12/23/2023   Post-op Post injection exam found visual acuity of at least counting fingers. The patient tolerated the procedure well. There were no complications. The patient received written and verbal post procedure care education.            ASSESSMENT/PLAN:    ICD-10-CM   1. 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 Essential hypertension  I10     3. Hypertensive retinopathy of both eyes  H35.033     4. History of retinal tear  Z86.69     5. S/P PKP (penetrating keratoplasty)  Z94.7     6. 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 BRVO w/ CME OS - pt reports spikes in BP as adjustments have been made with thyroid and BP medications  - pt reports SBP max of 190s several times - FA 03.12.24 shows mild  perivascular leakage SN macula - s/p IVA OS #1 (03.12.24), #2 (04.09.24), #3 (05.07.24), #4 (06.04.24), #5 (07.09.24), #6 (08.13.24), #7 (09.17.24), #8 (10.29.24) - BCVA improved to 20/25 from 20/40 - exam shows small cluster of IRH and exudates SN macula improving - OCT shows Stable improvement in IRF and mild interval improvement in [facility] greatest SN; focal subfoveal SRF -- stably improved at 7 weeks - recommend IVA OS #9 today, 12.17.24 with follow up extended to 9 weeks - RBA of procedure discussed, questions answered - IVA informed consent obtained and signed, 03.12.24 - see procedure note - F/U 9 weeks -- DFE/OCT/possible injection  2,3. Hypertensive retinopathy OU - discussed importance of tight BP control and relation to #1 above - monitor  4. Hx of retinal tears OU  - s/p laser retinopexy with Dr. Ashley Royalty (Aug 2015; OD 0800, OS 0300)  - stable  - no new RT/RD  5-8. 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 [name] - pt saw Dr. [name] on 04.16.24 for evaluation and management of uveitis / ocular inflammation - work up initiated by Dr. [name] and pt was started on Cellcept 500 mg BID - s/p PK surgery OD (Dr. [name], 10.31.24) -- next appt with Dr. Reece Agar is on Monday, December 23rd - Cellcept d/c'd and Valtrex continued per Dr. Reece Agar  9. Mixed Cataract OU - The symptoms of cataract, surgical options, and treatments and risks were discussed with patient. - discussed diagnosis and progression - likely  visually significant - under the expert management of Dr. [name] and [facility]  10. Ocular hypertension - IOP today: 16,18  - Brimonidine BID OU  Ophthalmic Meds Ordered this visit:  Meds ordered this encounter  Medications   Bevacizumab (AVASTIN) SOLN 1.25 mg     Return in about 9 weeks (around 12/03/2023) for f/u BRVO OS, DFE, OCT.  There are no Patient Instructions on file for this visit.   Explained  the diagnoses, plan, and follow up with the patient and they expressed understanding.  Patient expressed understanding of the importance of proper follow up care.   This document serves as a record of services personally performed by Karie Chimera, MD, PhD. It was created on their behalf by Laurey Morale, COT an ophthalmic technician. The creation of this record is the provider's dictation and/or activities during the visit.    Electronically signed by:  Charlette Caffey, COT  10/03/23 4:24 PM  This document serves as a record of services personally performed by Karie Chimera, MD, PhD. It was created on their behalf by Glee Arvin. Manson Passey, OA an ophthalmic technician. The creation of this record is the provider's dictation and/or activities during the visit.    Electronically signed by: Glee Arvin. Manson Passey, OA 10/03/23 4:24 PM  Karie Chimera, M.D., Ph.D. Diseases & Surgery of the Retina and Vitreous [facility]  I have reviewed the above documentation for accuracy and completeness, and I agree with the above. Karie Chimera, M.D., Ph.D. 10/03/23 9:57 PM   Abbreviations: M myopia (nearsighted); A astigmatism; H hyperopia (farsighted); P presbyopia; Mrx spectacle prescription;  CTL contact lenses; OD right eye; OS left eye; OU both eyes  XT exotropia; ET esotropia; PEK punctate epithelial keratitis; PEE punctate epithelial erosions; DES dry eye syndrome; MGD meibomian gland dysfunction; ATs artificial tears; PFAT's preservative free artificial tears; NSC nuclear sclerotic cataract; PSC posterior subcapsular cataract; ERM epi-retinal membrane; PVD posterior vitreous detachment; RD retinal detachment; DM diabetes mellitus; DR diabetic retinopathy; NPDR non-proliferative diabetic retinopathy; PDR proliferative diabetic retinopathy; CSME clinically significant macular edema; DME diabetic macular edema; dbh dot blot hemorrhages; CWS cotton wool spot; POAG primary open angle  glaucoma; C/D cup-to-disc ratio; HVF humphrey visual field; GVF goldmann visual field; OCT optical coherence tomography; IOP intraocular pressure; BRVO Branch retinal vein occlusion; CRVO central retinal vein occlusion; CRAO central retinal artery occlusion; BRAO branch retinal artery occlusion; RT retinal tear; SB scleral buckle; PPV pars plana vitrectomy; VH Vitreous hemorrhage; PRP panretinal laser photocoagulation; IVK intravitreal kenalog; VMT vitreomacular traction; MH Macular hole;  NVD neovascularization of the disc; NVE neovascularization elsewhere; AREDS age related eye disease study; ARMD age related macular degeneration; POAG primary open angle glaucoma; EBMD epithelial/anterior basement membrane dystrophy; ACIOL anterior chamber intraocular lens; IOL intraocular lens; PCIOL posterior chamber intraocular lens; Phaco/IOL phacoemulsification with intraocular lens placement; PRK photorefractive keratectomy; LASIK laser assisted in situ keratomileusis; HTN hypertension; DM diabetes mellitus; COPD chronic obstructive pulmonary disease

## 2023-10-01 ENCOUNTER — Encounter (INDEPENDENT_AMBULATORY_CARE_PROVIDER_SITE_OTHER): Payer: Self-pay | Admitting: Ophthalmology

## 2023-10-01 ENCOUNTER — Ambulatory Visit (INDEPENDENT_AMBULATORY_CARE_PROVIDER_SITE_OTHER): Payer: Medicare PPO | Admitting: Ophthalmology

## 2023-10-01 DIAGNOSIS — I10 Essential (primary) hypertension: Secondary | ICD-10-CM

## 2023-10-01 DIAGNOSIS — H179 Unspecified corneal scar and opacity: Secondary | ICD-10-CM

## 2023-10-01 DIAGNOSIS — Z8669 Personal history of other diseases of the nervous system and sense organs: Secondary | ICD-10-CM | POA: Diagnosis not present

## 2023-10-01 DIAGNOSIS — H34832 Tributary (branch) retinal vein occlusion, left eye, with macular edema: Secondary | ICD-10-CM

## 2023-10-01 DIAGNOSIS — H35033 Hypertensive retinopathy, bilateral: Secondary | ICD-10-CM | POA: Diagnosis not present

## 2023-10-01 DIAGNOSIS — H40053 Ocular hypertension, bilateral: Secondary | ICD-10-CM

## 2023-10-01 DIAGNOSIS — Z947 Corneal transplant status: Secondary | ICD-10-CM

## 2023-10-01 DIAGNOSIS — B0050 Herpesviral ocular disease, unspecified: Secondary | ICD-10-CM

## 2023-10-01 DIAGNOSIS — H25813 Combined forms of age-related cataract, bilateral: Secondary | ICD-10-CM

## 2023-10-01 MED ORDER — BEVACIZUMAB CHEMO INJECTION 1.25MG/0.05ML SYRINGE FOR KALEIDOSCOPE
1.2500 mg | INTRAVITREAL | Status: AC | PRN
  Administered 2023-10-01: 1.25 mg via INTRAVITREAL

## 2023-11-19 NOTE — Progress Notes (Addendum)
[facility] - Clinic Note  12/03/2023     CHIEF COMPLAINT Patient presents for Retina Follow Up   HISTORY OF PRESENT ILLNESS: Courtney Whitaker is a 68 y.o. female who presents to the clinic today for:   HPI     Retina Follow Up   Patient presents with  CRVO/BRVO.  In both eyes.  This started 9 weeks ago.  Duration of 9 weeks.  Since onset it is stable.  I, the attending physician,  performed the HPI with the patient and updated documentation appropriately.        Comments   9 week retina follow up BRVO OS and IVA OS pt is reporting no vision changes noticed she has some floaters but denies any flashes she is using Brimonidine BID OU and pred bid OD she saw [name], few weeks ago and she was asking about cataract surgery she was told he may do it in 6 months instead of a year that was original time       Last edited by [name], MD on 12/03/2023 12:07 PM.    Pt states she has an appt next Friday with Dr. [name], she has an appt with Dr. [name] this Friday per Dr. [name], she is unsure why [name] wants her to see [name]  Referring physician: No referring provider defined for this encounter.  HISTORICAL INFORMATION:   Selected notes from the MEDICAL RECORD NUMBER Referred by Dr. [name] for CSR LEE:  Ocular Hx- PMH-    CURRENT MEDICATIONS: Current Outpatient Medications (Ophthalmic Drugs)  Medication Sig   brimonidine (ALPHAGAN) 0.2 % ophthalmic solution Place 1 drop into both eyes 2 (two) times daily.   DUREZOL 0.05 % EMUL Place 1 drop into the right eye daily.   No current facility-administered medications for this visit. (Ophthalmic Drugs)   Current Outpatient Medications (Other)  Medication Sig   aspirin EC 81 MG tablet Take 81 mg by mouth daily.   citalopram (CELEXA) 40 MG tablet Take 40 mg by mouth daily.   cyanocobalamin (VITAMIN B12) 1000 MCG tablet Take 1,000 mcg by mouth daily.   Glucosamine-Chondroitin 500-400 MG CAPS Take  1 tablet by mouth daily.   levothyroxine (SYNTHROID) 25 MCG tablet Take 25 mcg by mouth daily before breakfast.   losartan (COZAAR) 50 MG tablet Take 50 mg by mouth daily.   Magnesium Gluconate 27.5 MG TABS Take 1 tablet by mouth daily.   mesalamine (LIALDA) 1.2 g EC tablet Take 1.2 g by mouth daily with breakfast.   metoprolol succinate (TOPROL-XL) 25 MG 24 hr tablet Take 12.5 mg by mouth daily.   mycophenolate (CELLCEPT) 500 MG tablet Take 500 mg by mouth 2 (two) times daily.   Omega 3 1000 MG CAPS Take 1 capsule by mouth daily.   valACYclovir (VALTREX) 500 MG tablet Take 500 mg by mouth 2 (two) times daily.   No current facility-administered medications for this visit. (Other)   REVIEW OF SYSTEMS: ROS   Positive for: Gastrointestinal, Endocrine, Cardiovascular, Eyes, Psychiatric Negative for: Constitutional, Neurological, Skin, Genitourinary, Musculoskeletal, HENT, Respiratory, Allergic/Imm, Heme/Lymph Last edited by Etheleen Mayhew, COT on 12/03/2023 10:03 AM.      ALLERGIES Allergies  Allergen Reactions   Penicillins    PAST MEDICAL HISTORY Past Medical History:  Diagnosis Date   Depression    Hypertension    Thyroid disease    Ulcerative colitis (HCC)    Past Surgical History:  Procedure Laterality Date   APPENDECTOMY  2023    FAMILY HISTORY Family History  Problem Relation Age of Onset   Diabetes Father    SOCIAL HISTORY Social History   Tobacco Use   Smoking status: Never   Smokeless tobacco: Never  Substance Use Topics   Alcohol use: Yes    Comment: Occasional, social   Drug use: Never       OPHTHALMIC EXAM:  Base Eye Exam     Visual Acuity (Snellen - Linear)       Right Left   Dist [place] HM 20/30 -2   Dist ph [place] NI 20/25 -1         Tonometry (Tonopen, 10:10 AM)       Right Left   Pressure 13 19         Pupils       Pupils Dark Light Shape React APD   Right PERRL 3 2 Round Brisk None   Left PERRL 3 2 Round Brisk None          Visual Fields       Left Right    Full Full         Neuro/Psych     Oriented x3: Yes   Mood/Affect: Normal         Dilation     Both eyes: 2.5% Phenylephrine @ 10:10 AM           Slit Lamp and Fundus Exam     Slit Lamp Exam       Right Left   Lids/Lashes Dermatochalasis - upper lid Dermatochalasis - upper lid   Conjunctiva/Sclera melanosis mild melanosis   Cornea S/p PK with nylon sutures intact, 0730 suture out, graft clear centrally, +endo pigment arcus   Anterior Chamber deep, deep and clear deep and clear, no cell or flare   Iris Irregular dilation, PS at 1030 Irregular dilation, PS at 0600   Lens White mature cataract 2-3+ Nuclear sclerosis, 3+ Cortical cataract   Anterior Vitreous Vitreous syneresis, Posterior vitreous detachment, vitreous condensations syneresis         Fundus Exam       Right Left   Disc No view mild Pallor, Sharp rim, +cupping   C/D Ratio  0.7   Macula No view Flat, good foveal reflex, RPE mottling, cluster of IRH and edema SN macula -- stably improved, punctate exudates SN fovea -- improved   Vessels No view attenuated, Tortuous, no sheathing, mild copper wiring   Periphery No view Attached, HST with good laser surrounding at 0300, no new RT/RD, No heme           IMAGING AND PROCEDURES  Imaging and Procedures for 12/03/2023  OCT, Retina - OU - Both Eyes       Right Eye Quality was poor. Findings include normal foveal contour, no IRF, no SRF (No interpretable images obtained).   Left Eye Quality was good. Central Foveal Thickness: 262. Progression has been stable. Findings include normal foveal contour, no IRF, no SRF, intraretinal hyper-reflective material (Stable improvement in IRF and IRHM; no SRF ).   Notes *Images captured and stored on drive  Diagnosis / Impression:  OD: No interpretable images obtained OS: Stable improvement in IRF and IRHM; no SRF   Clinical management:  See  below  Abbreviations: NFP - Normal foveal profile. CME - cystoid macular edema. PED - pigment epithelial detachment. IRF - intraretinal fluid. SRF - subretinal fluid. EZ - ellipsoid zone. ERM - epiretinal membrane. ORA - outer retinal atrophy.  ORT - outer retinal tubulation. SRHM - subretinal hyper-reflective material. IRHM - intraretinal hyper-reflective material      Intravitreal Injection, Pharmacologic Agent - OS - Left Eye       Time Out 12/03/2023. 11:07 AM. Confirmed correct patient, procedure, site, and patient consented.   Anesthesia Topical anesthesia was used. Anesthetic medications included Lidocaine 2%, Proparacaine 0.5%.   Procedure Preparation included 5% betadine to ocular surface, eyelid speculum. A (32g) needle was used.   Injection: 1.25 mg Bevacizumab 1.25mg /0.19ml   Route: Intravitreal, Site: Left Eye   NDC: [id number], Lot: [id number], Expiration date: 01/02/2024   Post-op Post injection exam found visual acuity of at least counting fingers. The patient tolerated the procedure well. There were no complications. The patient received written and verbal post procedure care education.            ASSESSMENT/PLAN:    ICD-10-CM   1. 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 Essential hypertension  I10     3. Hypertensive retinopathy of both eyes  H35.033     4. History of retinal tear  Z86.69     5. S/P PKP (penetrating keratoplasty)  Z94.7     6. 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 BRVO w/ CME OS - pt reports spikes in BP as adjustments have been made with thyroid and BP medications  - pt  reports SBP max of 190s several times - FA 03.12.24 shows mild perivascular leakage SN macula - s/p IVA OS #1 (03.12.24), #2 (04.09.24), #3 (05.07.24), #4 (06.04.24), #5 (07.09.24), #6 (08.13.24), #7 (09.17.24), #8 (10.29.24), #9 (12.17.24) - BCVA OS improved to 20/25 from 20/40 - exam shows small cluster of IRH and exudates SN macula improved - OCT shows Stable improvement in IRF and IRHM; no SRF at 9 weeks - recommend IVA OS #10 today, 02.18.25 - maintenance with follow up extended to 11 weeks - RBA of procedure discussed, questions answered - IVA informed consent obtained and signed, 03.12.24 - see procedure note - F/U 11 weeks -- DFE/OCT/possible injection  2,3. Hypertensive retinopathy OU - discussed importance of tight BP control and relation to #1 above - monitor  4. Hx of retinal tears OU  - s/p laser retinopexy with Dr. Ashley Royalty (Aug 2015; OD 0800, OS 0300)  - stable  - no new RT/RD  5-8. 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 [name] - pt saw Dr. [name] on 04.16.24 for evaluation and management of uveitis / ocular inflammation - work up initiated by Dr. [name] and pt was started on Cellcept 500 mg BID - s/p PK surgery OD (Dr. [name], 10.31.24) -- graft clear and doing well - Cellcept d/c'd and Valtrex continued per Dr. Reece Agar - scheduled to f/u with [name] this Friday (02.21.25)  9. Mixed Cataract OU - The symptoms of cataract, surgical options, and treatments and risks were discussed with patient. - discussed diagnosis and progression - likely visually significant - under  the expert management of Dr. [name] and [facility]  10. Ocular hypertension - IOP today: 13,19  - Brimonidine BID OU  Ophthalmic Meds Ordered this visit:  Meds ordered this encounter  Medications   Bevacizumab (AVASTIN) SOLN 1.25 mg     Return in about 11 weeks (around 02/18/2024) for f/u BRVO OS, DFE, OCT, Possible  Injxn.  There are no Patient Instructions on file for this visit.   Explained the diagnoses, plan, and follow up with the patient and they expressed understanding.  Patient expressed understanding of the importance of proper follow up care.   This document serves as a record of services personally performed by Karie Chimera, MD, PhD. It was created on their behalf by Laurey Morale, COT an ophthalmic technician. The creation of this record is the provider's dictation and/or activities during the visit.    Electronically signed by:  Charlette Caffey, COT  12/03/23 12:11 PM  This document serves as a record of services personally performed by Karie Chimera, MD, PhD. It was created on their behalf by Glee Arvin. Manson Passey, OA an ophthalmic technician. The creation of this record is the provider's dictation and/or activities during the visit.    Electronically signed by: Glee Arvin. Manson Passey, OA 12/03/23 12:11 PM  Karie Chimera, M.D., Ph.D. Diseases & Surgery of the Retina and Vitreous [facility]  I have reviewed the above documentation for accuracy and completeness, and I agree with the above. Karie Chimera, M.D., Ph.D. 12/03/23 12:11 PM   Abbreviations: M myopia (nearsighted); A astigmatism; H hyperopia (farsighted); P presbyopia; Mrx spectacle prescription;  CTL contact lenses; OD right eye; OS left eye; OU both eyes  XT exotropia; ET esotropia; PEK punctate epithelial keratitis; PEE punctate epithelial erosions; DES dry eye syndrome; MGD meibomian gland dysfunction; ATs artificial tears; PFAT's preservative free artificial tears; NSC nuclear sclerotic cataract; PSC posterior subcapsular cataract; ERM epi-retinal membrane; PVD posterior vitreous detachment; RD retinal detachment; DM diabetes mellitus; DR diabetic retinopathy; NPDR non-proliferative diabetic retinopathy; PDR proliferative diabetic retinopathy; CSME clinically significant macular edema; DME diabetic  macular edema; dbh dot blot hemorrhages; CWS cotton wool spot; POAG primary open angle glaucoma; C/D cup-to-disc ratio; HVF humphrey visual field; GVF goldmann visual field; OCT optical coherence tomography; IOP intraocular pressure; BRVO Branch retinal vein occlusion; CRVO central retinal vein occlusion; CRAO central retinal artery occlusion; BRAO branch retinal artery occlusion; RT retinal tear; SB scleral buckle; PPV pars plana vitrectomy; VH Vitreous hemorrhage; PRP panretinal laser photocoagulation; IVK intravitreal kenalog; VMT vitreomacular traction; MH Macular hole;  NVD neovascularization of the disc; NVE neovascularization elsewhere; AREDS age related eye disease study; ARMD age related macular degeneration; POAG primary open angle glaucoma; EBMD epithelial/anterior basement membrane dystrophy; ACIOL anterior chamber intraocular lens; IOL intraocular lens; PCIOL posterior chamber intraocular lens; Phaco/IOL phacoemulsification with intraocular lens placement; PRK photorefractive keratectomy; LASIK laser assisted in situ keratomileusis; HTN hypertension; DM diabetes mellitus; COPD chronic obstructive pulmonary disease

## 2023-12-03 ENCOUNTER — Ambulatory Visit (INDEPENDENT_AMBULATORY_CARE_PROVIDER_SITE_OTHER): Payer: Medicare PPO | Admitting: Ophthalmology

## 2023-12-03 ENCOUNTER — Encounter (INDEPENDENT_AMBULATORY_CARE_PROVIDER_SITE_OTHER): Payer: Self-pay | Admitting: Ophthalmology

## 2023-12-03 DIAGNOSIS — H34832 Tributary (branch) retinal vein occlusion, left eye, with macular edema: Secondary | ICD-10-CM | POA: Diagnosis not present

## 2023-12-03 DIAGNOSIS — H25813 Combined forms of age-related cataract, bilateral: Secondary | ICD-10-CM

## 2023-12-03 DIAGNOSIS — I10 Essential (primary) hypertension: Secondary | ICD-10-CM

## 2023-12-03 DIAGNOSIS — H179 Unspecified corneal scar and opacity: Secondary | ICD-10-CM

## 2023-12-03 DIAGNOSIS — H40053 Ocular hypertension, bilateral: Secondary | ICD-10-CM

## 2023-12-03 DIAGNOSIS — Z8669 Personal history of other diseases of the nervous system and sense organs: Secondary | ICD-10-CM | POA: Diagnosis not present

## 2023-12-03 DIAGNOSIS — H35033 Hypertensive retinopathy, bilateral: Secondary | ICD-10-CM | POA: Diagnosis not present

## 2023-12-03 DIAGNOSIS — Z947 Corneal transplant status: Secondary | ICD-10-CM

## 2023-12-03 DIAGNOSIS — B0050 Herpesviral ocular disease, unspecified: Secondary | ICD-10-CM

## 2023-12-03 MED ORDER — BEVACIZUMAB CHEMO INJECTION 1.25MG/0.05ML SYRINGE FOR KALEIDOSCOPE
1.2500 mg | INTRAVITREAL | Status: AC | PRN
Start: 2023-12-03 — End: 2023-12-03
  Administered 2023-12-03: 1.25 mg via INTRAVITREAL

## 2024-02-04 NOTE — Progress Notes (Addendum)
 [facility] - Clinic Note  02/18/2024     CHIEF COMPLAINT Patient presents for Retina Follow Up and Branch Retinal Vein Occlusion   HISTORY OF PRESENT ILLNESS: Courtney Whitaker is a 68 y.o. female who presents to the clinic today for:   HPI     Retina Follow Up   Patient presents with  CRVO/BRVO.  In both eyes.  This started 14 months ago.  Duration of 11 weeks.  Since onset it is stable.  I, the attending physician,  performed the HPI with the patient and updated documentation appropriately.        Comments   Pt presents for 11 week retina follow up, BRVO OS and IVA OS. Pt states no changes vision has been about the same but she is having more difficulty reading and relying on her left eye for vision. Pt states she is experiencing ocular pain mainly in the left eye. Pt denies FOL/floaters Pt is consistent with Brimonidine  BID OU and pred bid OD. Pt is using Retain prn.       Last edited by Ronelle Coffee, MD on 02/18/2024 12:08 PM.     Pt states   Referring physician: No referring provider defined for this encounter.  HISTORICAL INFORMATION:   Selected notes from the MEDICAL RECORD NUMBER Referred by Dr. Daneil Dunker for CSR LEE:  Ocular Hx- PMH-    CURRENT MEDICATIONS: Current Outpatient Medications (Ophthalmic Drugs)  Medication Sig   brimonidine  (ALPHAGAN ) 0.2 % ophthalmic solution Place 1 drop into both eyes 2 (two) times daily.   DUREZOL 0.05 % EMUL Place 1 drop into the right eye daily.   No current facility-administered medications for this visit. (Ophthalmic Drugs)   Current Outpatient Medications (Other)  Medication Sig   aspirin EC 81 MG tablet Take 81 mg by mouth daily.   citalopram (CELEXA) 40 MG tablet Take 40 mg by mouth daily.   cyanocobalamin (VITAMIN B12) 1000 MCG tablet Take 1,000 mcg by mouth daily.   Glucosamine-Chondroitin 500-400 MG CAPS Take 1 tablet by mouth daily.   levothyroxine (SYNTHROID) 25 MCG tablet Take 25 mcg by mouth  daily before breakfast.   losartan (COZAAR) 50 MG tablet Take 50 mg by mouth daily.   Magnesium Gluconate 27.5 MG TABS Take 1 tablet by mouth daily.   mesalamine (LIALDA) 1.2 g EC tablet Take 1.2 g by mouth daily with breakfast.   mycophenolate (CELLCEPT) 500 MG tablet Take 500 mg by mouth 2 (two) times daily.   Omega 3 1000 MG CAPS Take 1 capsule by mouth daily.   valACYclovir (VALTREX) 500 MG tablet Take 500 mg by mouth 2 (two) times daily.   metoprolol succinate (TOPROL-XL) 25 MG 24 hr tablet Take 12.5 mg by mouth daily. (Patient not taking: Reported on 02/18/2024)   No current facility-administered medications for this visit. (Other)   REVIEW OF SYSTEMS: ROS   Positive for: Gastrointestinal, Endocrine, Cardiovascular, Eyes, Psychiatric Negative for: Constitutional, Neurological, Skin, Genitourinary, Musculoskeletal, HENT, Respiratory, Allergic/Imm, Heme/Lymph Last edited by Carrington Clack, COT on 02/18/2024  9:36 AM.       ALLERGIES Allergies  Allergen Reactions   Penicillins    PAST MEDICAL HISTORY Past Medical History:  Diagnosis Date   Depression    Hypertension    Thyroid disease    Ulcerative colitis (HCC)    Past Surgical History:  Procedure Laterality Date   APPENDECTOMY     2023    FAMILY HISTORY Family History  Problem Relation  Age of Onset   Diabetes Father    SOCIAL HISTORY Social History   Tobacco Use   Smoking status: Never   Smokeless tobacco: Never  Substance Use Topics   Alcohol use: Yes    Comment: Occasional, social   Drug use: Never       OPHTHALMIC EXAM:  Base Eye Exam     Visual Acuity (Snellen - Linear)       Right Left   Dist [place] HM 20/40 -2   Dist ph [place] NI 20/25 -2         Tonometry (Tonopen, 9:50 AM)       Right Left   Pressure 12 14         Pupils       Dark Light Shape React APD   Right 3 2 Irregular Slow None   Left 2 1 Round Brisk None         Visual Fields       Left Right    Full Full          Extraocular Movement       Right Left    Full, Ortho Full, Ortho         Neuro/Psych     Oriented x3: Yes   Mood/Affect: Normal         Dilation     Both eyes: 1.0% Mydriacyl, 2.5% Phenylephrine @ 9:51 AM           Slit Lamp and Fundus Exam     Slit Lamp Exam       Right Left   Lids/Lashes Dermatochalasis - upper lid Dermatochalasis - upper lid   Conjunctiva/Sclera melanosis mild melanosis   Cornea S/p PK with nylon sutures intact, 0730 suture out, graft clear centrally, +endo pigment arcus   Anterior Chamber deep, deep and clear deep and clear, no cell or flare   Iris Irregular dilation, PS at 1030 Irregular dilation, PS at 0600   Lens White mature cataract 2-3+ Nuclear sclerosis, 3+ Cortical cataract   Anterior Vitreous Vitreous syneresis, Posterior vitreous detachment, vitreous condensations syneresis         Fundus Exam       Right Left   Disc No view mild Pallor, Sharp rim, +cupping   C/D Ratio  0.7   Macula No view Flat, good foveal reflex, RPE mottling, cluster of IRH and edema SN macula -- stably improved, punctate exudates SN fovea -- improved   Vessels No view attenuated, Tortuous, no sheathing, mild copper wiring   Periphery No view Attached, HST with good laser surrounding at 0300, no new RT/RD, No heme           IMAGING AND PROCEDURES  Imaging and Procedures for 02/18/2024  OCT, Retina - OU - Both Eyes       Right Eye Quality was poor. Findings include normal foveal contour, no IRF, no SRF (No interpretable images obtained).   Left Eye Quality was good. Central Foveal Thickness: 259. Progression has been stable. Findings include normal foveal contour, no IRF, no SRF, intraretinal hyper-reflective material (Stable improvement in IRF and IRHM; no SRF ).   Notes *Images captured and stored on drive  Diagnosis / Impression:  OD: No interpretable images obtained OS: Stable improvement in IRF and IRHM; no SRF   Clinical management:   See below  Abbreviations: NFP - Normal foveal profile. CME - cystoid macular edema. PED - pigment epithelial detachment. IRF - intraretinal fluid. SRF - subretinal fluid.  EZ - ellipsoid zone. ERM - epiretinal membrane. ORA - outer retinal atrophy. ORT - outer retinal tubulation. SRHM - subretinal hyper-reflective material. IRHM - intraretinal hyper-reflective material      Intravitreal Injection, Pharmacologic Agent - OS - Left Eye       Time Out 02/18/2024. 10:46 AM. Confirmed correct patient, procedure, site, and patient consented.   Anesthesia Topical anesthesia was used. Anesthetic medications included Lidocaine 2%, Proparacaine 0.5%.   Procedure Preparation included 5% betadine to ocular surface, eyelid speculum. A supplied (32g) needle was used.   Injection: 1.25 mg Bevacizumab  1.25mg /0.62ml   Route: Intravitreal, Site: Left Eye   NDC: [id number], Lot: [id number] , Expiration date: 03/02/2024   Post-op Post injection exam found visual acuity of at least counting fingers. The patient tolerated the procedure well. There were no complications. The patient received written and verbal post procedure care education.             ASSESSMENT/PLAN:    ICD-10-CM   1. Branch retinal vein occlusion of left eye with macular edema  H34.8320 OCT, Retina - OU - Both Eyes    Intravitreal Injection, Pharmacologic Agent - OS - Left Eye    Bevacizumab  (AVASTIN ) SOLN 1.25 mg    2. Essential hypertension  I10     3. Hypertensive retinopathy of both eyes  H35.033     4. History of retinal tear  Z86.69     5. S/P PKP (penetrating keratoplasty)  Z94.7     6. 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 BRVO w/ CME OS - pt reports spikes in BP as adjustments have been made with thyroid and BP  medications  - pt reports SBP max of 190s several times - FA 03.12.24 shows mild perivascular leakage SN macula - s/p IVA OS #1 (03.12.24), #2 (04.09.24), #3 (05.07.24), #4 (06.04.24), #5 (07.09.24), #6 (08.13.24), #7 (09.17.24), #8 (10.29.24), #9 (12.17.24), #10 (02.18.25) - BCVA OS improved to 20/25 from 20/40 - exam shows small cluster of IRH and exudates SN macula improved - OCT shows Stable improvement in IRF and IRHM; no SRF at 11 weeks - recommend IVA OS #11 today, 05.06.25 - maintenance with follow up extended to 13 weeks - RBA of procedure discussed, questions answered - IVA informed consent obtained and signed, 03.12.24 - see procedure note - F/U 13 weeks -- DFE/OCT/possible injection  2,3. Hypertensive retinopathy OU - discussed importance of tight BP control and relation to #1 above - monitor  4. Hx of retinal tears OU  - s/p laser retinopexy with Dr. Augustus Ledger (Aug 2015; OD 0800, OS 0300)  - stable  - no new RT/RD  5-8. 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 Lunda Salines - pt saw Dr. Mason Sole on 04.16.24 for evaluation and management of uveitis / ocular inflammation - work up initiated by Dr. Mason Sole and pt was started on Cellcept 500 mg BID - s/p PK surgery OD (Dr. Lunda Salines, 10.31.24) -- graft clear and doing well - Cellcept d/c'd and Valtrex continued per Dr. Crissie Dome  9. Mixed Cataract OU - The symptoms of cataract, surgical options, and treatments and risks were discussed with patient. - discussed diagnosis  and progression - likely visually significant  10. Ocular hypertension - IOP today: 12,14  - Brimonidine  BID OU  Ophthalmic Meds Ordered this visit:  Meds ordered this encounter  Medications   Bevacizumab  (AVASTIN ) SOLN 1.25 mg     Return in about 13 weeks (around 05/19/2024) for BRVO OS, DFE, OCT, likley IVA OS.  There are no Patient Instructions on file for this visit.   Explained the diagnoses,  plan, and follow up with the patient and they expressed understanding.  Patient expressed understanding of the importance of proper follow up care.   This document serves as a record of services personally performed by Jeanice Millard, MD, PhD. It was created on their behalf by Arn Lane, COT an ophthalmic technician. The creation of this record is the provider's dictation and/or activities during the visit.    Electronically signed by:  Olene Berne, COT  02/18/24 12:10 PM  This document serves as a record of services personally performed by Jeanice Millard, MD, PhD. It was created on their behalf by Angelia Kelp, an ophthalmic technician. The creation of this record is the provider's dictation and/or activities during the visit.    Electronically signed by: Angelia Kelp, OA, 02/18/24  12:10 PM   Jeanice Millard, M.D., Ph.D. Diseases & Surgery of the Retina and Vitreous [facility]  I have reviewed the above documentation for accuracy and completeness, and I agree with the above. Jeanice Millard, M.D., Ph.D. 02/18/24 12:10 PM   Abbreviations: M myopia (nearsighted); A astigmatism; H hyperopia (farsighted); P presbyopia; Mrx spectacle prescription;  CTL contact lenses; OD right eye; OS left eye; OU both eyes  XT exotropia; ET esotropia; PEK punctate epithelial keratitis; PEE punctate epithelial erosions; DES dry eye syndrome; MGD meibomian gland dysfunction; ATs artificial tears; PFAT's preservative free artificial tears; NSC nuclear sclerotic cataract; PSC posterior subcapsular cataract; ERM epi-retinal membrane; PVD posterior vitreous detachment; RD retinal detachment; DM diabetes mellitus; DR diabetic retinopathy; NPDR non-proliferative diabetic retinopathy; PDR proliferative diabetic retinopathy; CSME clinically significant macular edema; DME diabetic macular edema; dbh dot blot hemorrhages; CWS cotton wool spot; POAG primary open angle glaucoma; C/D  cup-to-disc ratio; HVF humphrey visual field; GVF goldmann visual field; OCT optical coherence tomography; IOP intraocular pressure; BRVO Branch retinal vein occlusion; CRVO central retinal vein occlusion; CRAO central retinal artery occlusion; BRAO branch retinal artery occlusion; RT retinal tear; SB scleral buckle; PPV pars plana vitrectomy; VH Vitreous hemorrhage; PRP panretinal laser photocoagulation; IVK intravitreal kenalog; VMT vitreomacular traction; MH Macular hole;  NVD neovascularization of the disc; NVE neovascularization elsewhere; AREDS age related eye disease study; ARMD age related macular degeneration; POAG primary open angle glaucoma; EBMD epithelial/anterior basement membrane dystrophy; ACIOL anterior chamber intraocular lens; IOL intraocular lens; PCIOL posterior chamber intraocular lens; Phaco/IOL phacoemulsification with intraocular lens placement; PRK photorefractive keratectomy; LASIK laser assisted in situ keratomileusis; HTN hypertension; DM diabetes mellitus; COPD chronic obstructive pulmonary disease

## 2024-02-18 ENCOUNTER — Ambulatory Visit (INDEPENDENT_AMBULATORY_CARE_PROVIDER_SITE_OTHER): Payer: Medicare PPO | Admitting: Ophthalmology

## 2024-02-18 ENCOUNTER — Encounter (INDEPENDENT_AMBULATORY_CARE_PROVIDER_SITE_OTHER): Payer: Self-pay | Admitting: Ophthalmology

## 2024-02-18 DIAGNOSIS — H25813 Combined forms of age-related cataract, bilateral: Secondary | ICD-10-CM

## 2024-02-18 DIAGNOSIS — Z8669 Personal history of other diseases of the nervous system and sense organs: Secondary | ICD-10-CM

## 2024-02-18 DIAGNOSIS — H34832 Tributary (branch) retinal vein occlusion, left eye, with macular edema: Secondary | ICD-10-CM

## 2024-02-18 DIAGNOSIS — H40053 Ocular hypertension, bilateral: Secondary | ICD-10-CM

## 2024-02-18 DIAGNOSIS — I10 Essential (primary) hypertension: Secondary | ICD-10-CM | POA: Diagnosis not present

## 2024-02-18 DIAGNOSIS — H35033 Hypertensive retinopathy, bilateral: Secondary | ICD-10-CM | POA: Diagnosis not present

## 2024-02-18 DIAGNOSIS — H179 Unspecified corneal scar and opacity: Secondary | ICD-10-CM

## 2024-02-18 DIAGNOSIS — B0050 Herpesviral ocular disease, unspecified: Secondary | ICD-10-CM

## 2024-02-18 DIAGNOSIS — Z947 Corneal transplant status: Secondary | ICD-10-CM

## 2024-02-18 MED ORDER — BEVACIZUMAB CHEMO INJECTION 1.25MG/0.05ML SYRINGE FOR KALEIDOSCOPE
1.2500 mg | INTRAVITREAL | Status: AC | PRN
  Administered 2024-02-18: 1.25 mg via INTRAVITREAL

## 2024-03-20 ENCOUNTER — Other Ambulatory Visit (INDEPENDENT_AMBULATORY_CARE_PROVIDER_SITE_OTHER): Payer: Self-pay | Admitting: Ophthalmology

## 2024-05-11 NOTE — Progress Notes (Shared)
 [facility] - Clinic Note  05/19/2024     CHIEF COMPLAINT Patient presents for No chief complaint on file.   HISTORY OF PRESENT ILLNESS: Courtney Whitaker is a 68 y.o. female who presents to the clinic today for:     Pt states   Referring physician: No referring provider defined for this encounter.  HISTORICAL INFORMATION:   Selected notes from the MEDICAL RECORD NUMBER Referred by Dr. Kennyth for CSR LEE:  Ocular Hx- PMH-    CURRENT MEDICATIONS: Current Outpatient Medications (Ophthalmic Drugs)  Medication Sig   brimonidine  (ALPHAGAN ) 0.2 % ophthalmic solution INSTILL 1 DROP INTO BOTH EYES TWICE A DAY   DUREZOL 0.05 % EMUL Place 1 drop into the right eye daily.   No current facility-administered medications for this visit. (Ophthalmic Drugs)   Current Outpatient Medications (Other)  Medication Sig   aspirin EC 81 MG tablet Take 81 mg by mouth daily.   citalopram (CELEXA) 40 MG tablet Take 40 mg by mouth daily.   cyanocobalamin (VITAMIN B12) 1000 MCG tablet Take 1,000 mcg by mouth daily.   Glucosamine-Chondroitin 500-400 MG CAPS Take 1 tablet by mouth daily.   levothyroxine (SYNTHROID) 25 MCG tablet Take 25 mcg by mouth daily before breakfast.   losartan (COZAAR) 50 MG tablet Take 50 mg by mouth daily.   Magnesium Gluconate 27.5 MG TABS Take 1 tablet by mouth daily.   mesalamine (LIALDA) 1.2 g EC tablet Take 1.2 g by mouth daily with breakfast.   metoprolol succinate (TOPROL-XL) 25 MG 24 hr tablet Take 12.5 mg by mouth daily. (Patient not taking: Reported on 02/18/2024)   mycophenolate (CELLCEPT) 500 MG tablet Take 500 mg by mouth 2 (two) times daily.   Omega 3 1000 MG CAPS Take 1 capsule by mouth daily.   valACYclovir (VALTREX) 500 MG tablet Take 500 mg by mouth 2 (two) times daily.   No current facility-administered medications for this visit. (Other)   REVIEW OF SYSTEMS:     ALLERGIES Allergies  Allergen Reactions   Penicillins    PAST  MEDICAL HISTORY Past Medical History:  Diagnosis Date   Depression    Hypertension    Thyroid disease    Ulcerative colitis (HCC)    Past Surgical History:  Procedure Laterality Date   APPENDECTOMY     2023    FAMILY HISTORY Family History  Problem Relation Age of Onset   Diabetes Father    SOCIAL HISTORY Social History   Tobacco Use   Smoking status: Never   Smokeless tobacco: Never  Substance Use Topics   Alcohol use: Yes    Comment: Occasional, social   Drug use: Never       OPHTHALMIC EXAM:  Not recorded    IMAGING AND PROCEDURES  Imaging and Procedures for 05/19/2024           ASSESSMENT/PLAN:    ICD-10-CM   1. Branch retinal vein occlusion of left eye with macular edema  H34.8320     2. Essential hypertension  I10     3. Hypertensive retinopathy of both eyes  H35.033     4. History of retinal tear  Z86.69     5. S/P PKP (penetrating keratoplasty)  Z94.7     6. 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 BRVO w/ CME OS - pt reports spikes in BP as adjustments have been made with thyroid and BP medications  - pt reports SBP max of 190s several times - FA 03.12.24 shows mild perivascular leakage SN macula - s/p IVA OS #1 (03.12.24), #2 (04.09.24), #3 (05.07.24), #4 (06.04.24), #5 (07.09.24), #6 (08.13.24), #7 (09.17.24), #8 (10.29.24), #9 (12.17.24), #10 (02.18.25), #11 (05.06.25) - BCVA OS improved to 20/25 from 20/40 - exam shows small cluster of IRH and exudates SN macula improved - OCT shows Stable improvement in IRF and IRHM; no SRF at 11 weeks - recommend IVA OS #12 today, 08.05.25 - maintenance with follow up extended to 13 weeks - RBA of procedure discussed, questions answered - IVA informed consent obtained and signed, 03.12.24 - see  procedure note - F/U 13 weeks -- DFE/OCT/possible injection  2,3. Hypertensive retinopathy OU - discussed importance of tight BP control and relation to #1 above - monitor  4. Hx of retinal tears OU  - s/p laser retinopexy with Dr. Alvia (Aug 2015; OD 0800, OS 0300)  - stable  - no new RT/RD  5-8. 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 Caresse - pt saw Dr. Maree on 04.16.24 for evaluation and management of uveitis / ocular inflammation - work up initiated by Dr. Maree and pt was started on Cellcept 500 mg BID - s/p PK surgery OD (Dr. Caresse, 10.31.24) -- graft clear and doing well - Cellcept d/c'd and Valtrex continued per Dr. Caresse  9. Mixed Cataract OU - The symptoms of cataract, surgical options, and treatments and risks were discussed with patient. 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    No follow-ups on file.  There are no Patient Instructions on file for this visit.   Explained the diagnoses, plan, and follow up with the patient and they expressed understanding.  Patient expressed understanding of the importance of proper follow up care.   This document serves as a record of services personally performed by Redell JUDITHANN Hans, MD, PhD. It was created on their behalf by Avelina Pereyra, COA an ophthalmic technician. The creation of this record is the provider's dictation and/or activities during the visit.   Electronically signed by: Avelina GORMAN Pereyra, COT  05/11/24  3:21 PM     Redell JUDITHANN Hans, M.D., Ph.D. Diseases & Surgery of the Retina and Vitreous [facility]    Abbreviations: M myopia (nearsighted); A astigmatism; H hyperopia (farsighted); P presbyopia; Mrx  spectacle prescription;  CTL contact lenses; OD right eye; OS left eye; OU both eyes  XT exotropia; ET esotropia; PEK punctate epithelial keratitis; PEE punctate epithelial erosions; DES dry eye syndrome; MGD meibomian gland dysfunction; ATs artificial tears; PFAT's preservative free artificial tears; NSC nuclear sclerotic cataract; PSC posterior subcapsular cataract; ERM epi-retinal membrane; PVD posterior vitreous detachment; RD retinal detachment; DM diabetes mellitus; DR diabetic retinopathy; NPDR non-proliferative diabetic retinopathy; PDR proliferative diabetic retinopathy; CSME clinically significant macular edema; DME diabetic macular edema; dbh dot blot hemorrhages; CWS cotton wool spot; POAG primary open angle glaucoma; C/D cup-to-disc ratio; HVF humphrey visual field; GVF goldmann visual field; OCT optical coherence  tomography; IOP intraocular pressure; BRVO Branch retinal vein occlusion; CRVO central retinal vein occlusion; CRAO central retinal artery occlusion; BRAO branch retinal artery occlusion; RT retinal tear; SB scleral buckle; PPV pars plana vitrectomy; VH Vitreous hemorrhage; PRP panretinal laser photocoagulation; IVK intravitreal kenalog; VMT vitreomacular traction; MH Macular hole;  NVD neovascularization of the disc; NVE neovascularization elsewhere; AREDS age related eye disease study; ARMD age related macular degeneration; POAG primary open angle glaucoma; EBMD epithelial/anterior basement membrane dystrophy; ACIOL anterior chamber intraocular lens; IOL intraocular lens; PCIOL posterior chamber intraocular lens; Phaco/IOL phacoemulsification with intraocular lens placement; PRK photorefractive keratectomy; LASIK laser assisted in situ keratomileusis; HTN hypertension; DM diabetes mellitus; COPD chronic obstructive pulmonary disease

## 2024-05-19 ENCOUNTER — Encounter (INDEPENDENT_AMBULATORY_CARE_PROVIDER_SITE_OTHER): Admitting: Ophthalmology

## 2024-05-19 DIAGNOSIS — B0050 Herpesviral ocular disease, unspecified: Secondary | ICD-10-CM

## 2024-05-19 DIAGNOSIS — H35033 Hypertensive retinopathy, bilateral: Secondary | ICD-10-CM

## 2024-05-19 DIAGNOSIS — H40053 Ocular hypertension, bilateral: Secondary | ICD-10-CM

## 2024-05-19 DIAGNOSIS — H25813 Combined forms of age-related cataract, bilateral: Secondary | ICD-10-CM

## 2024-05-19 DIAGNOSIS — I10 Essential (primary) hypertension: Secondary | ICD-10-CM

## 2024-05-19 DIAGNOSIS — H34832 Tributary (branch) retinal vein occlusion, left eye, with macular edema: Secondary | ICD-10-CM

## 2024-05-19 DIAGNOSIS — Z8669 Personal history of other diseases of the nervous system and sense organs: Secondary | ICD-10-CM

## 2024-05-19 DIAGNOSIS — Z947 Corneal transplant status: Secondary | ICD-10-CM

## 2024-05-19 DIAGNOSIS — H179 Unspecified corneal scar and opacity: Secondary | ICD-10-CM

## 2024-05-26 NOTE — Progress Notes (Signed)
 [facility] - Clinic Note  05/27/2024     CHIEF COMPLAINT Patient presents for Retina Follow Up   HISTORY OF PRESENT ILLNESS: Courtney Whitaker is a 68 y.o. female who presents to the clinic today for:   HPI     Retina Follow Up   Patient presents with  CRVO/BRVO.  In left eye.  Severity is moderate.  Duration of 14 weeks.  Since onset it is stable.  I, the attending physician,  performed the HPI with the patient and updated documentation appropriately.        Comments   14 week Retina eval. Patient states no changes in vision. The left eye get blurred and she blinks a lot to clear it up.      Last edited by Valdemar Rogue, MD on 05/27/2024  3:32 PM.      Pt states   Referring physician: No referring provider defined for this encounter.  HISTORICAL INFORMATION:   Selected notes from the MEDICAL RECORD NUMBER Referred by Dr. Kennyth for CSR LEE:  Ocular Hx- PMH-    CURRENT MEDICATIONS: Current Outpatient Medications (Ophthalmic Drugs)  Medication Sig   brimonidine  (ALPHAGAN ) 0.2 % ophthalmic solution INSTILL 1 DROP INTO BOTH EYES TWICE A DAY   DUREZOL 0.05 % EMUL Place 1 drop into the right eye daily.   No current facility-administered medications for this visit. (Ophthalmic Drugs)   Current Outpatient Medications (Other)  Medication Sig   aspirin EC 81 MG tablet Take 81 mg by mouth daily.   citalopram (CELEXA) 40 MG tablet Take 40 mg by mouth daily.   cyanocobalamin (VITAMIN B12) 1000 MCG tablet Take 1,000 mcg by mouth daily.   Glucosamine-Chondroitin 500-400 MG CAPS Take 1 tablet by mouth daily.   levothyroxine (SYNTHROID) 25 MCG tablet Take 25 mcg by mouth daily before breakfast.   losartan (COZAAR) 50 MG tablet Take 50 mg by mouth daily.   Magnesium Gluconate 27.5 MG TABS Take 1 tablet by mouth daily.   mesalamine (LIALDA) 1.2 g EC tablet Take 1.2 g by mouth daily with breakfast.   mycophenolate (CELLCEPT) 500 MG tablet Take 500 mg by  mouth 2 (two) times daily.   Omega 3 1000 MG CAPS Take 1 capsule by mouth daily.   valACYclovir (VALTREX) 500 MG tablet Take 500 mg by mouth 2 (two) times daily.   metoprolol succinate (TOPROL-XL) 25 MG 24 hr tablet Take 12.5 mg by mouth daily. (Patient not taking: Reported on 02/18/2024)   No current facility-administered medications for this visit. (Other)   REVIEW OF SYSTEMS: ROS   Positive for: Gastrointestinal, Endocrine, Cardiovascular, Eyes, Psychiatric Negative for: Constitutional, Neurological, Skin, Genitourinary, Musculoskeletal, HENT, Respiratory, Allergic/Imm, Heme/Lymph Last edited by German Olam BRAVO, COT on 05/27/2024  2:14 PM.     ALLERGIES Allergies  Allergen Reactions   Penicillins    PAST MEDICAL HISTORY Past Medical History:  Diagnosis Date   Depression    Hypertension    Thyroid disease    Ulcerative colitis (HCC)    Past Surgical History:  Procedure Laterality Date   APPENDECTOMY     2023    FAMILY HISTORY Family History  Problem Relation Age of Onset   Diabetes Father    SOCIAL HISTORY Social History   Tobacco Use   Smoking status: Never   Smokeless tobacco: Never  Substance Use Topics   Alcohol use: Yes    Comment: Occasional, social   Drug use: Never  OPHTHALMIC EXAM:  Base Eye Exam     Visual Acuity (Snellen - Linear)       Right Left   Dist [place] 20/HM 20/30 -1   Dist ph [place]  20/25 -2         Tonometry (Tonopen, 2:24 PM)       Right Left   Pressure 12 14         Pupils       Dark Light Shape React APD   Right 3 2 Irregular Minimal Trace   Left 3 2 Round Brisk None         Visual Fields (Counting fingers)       Left Right    Full Full         Extraocular Movement       Right Left    Full, Ortho Full, Ortho         Neuro/Psych     Oriented x3: Yes   Mood/Affect: Normal         Dilation     Both eyes: 1.0% Mydriacyl, 2.5% Phenylephrine @ 2:24 PM           Slit Lamp and Fundus Exam      Slit Lamp Exam       Right Left   Lids/Lashes Dermatochalasis - upper lid Dermatochalasis - upper lid   Conjunctiva/Sclera melanosis mild melanosis   Cornea S/p PK with 4 nylon sutures intact, graft clear arcus   Anterior Chamber deep, deep and clear deep and clear, no cell or flare   Iris Irregular dilation, PS at 1030 Irregular dilation, PS at 0600   Lens White mature cataract 2-3+ Nuclear sclerosis, 3+ Cortical cataract   Anterior Vitreous Vitreous syneresis, Posterior vitreous detachment, vitreous condensations syneresis         Fundus Exam       Right Left   Disc No view mild Pallor, Sharp rim, +cupping   C/D Ratio  0.7   Macula No view Flat, good foveal reflex, RPE mottling, cluster of IRH and edema SN macula -- stably improved, punctate exudates SN fovea -- improved   Vessels No view attenuated, Tortuous, no sheathing, mild copper wiring   Periphery No view Attached, HST with good laser surrounding at 0300, no new RT/RD, No heme           IMAGING AND PROCEDURES  Imaging and Procedures for 05/27/2024  OCT, Retina - OU - Both Eyes       Right Eye Quality was poor. Findings include normal foveal contour, no IRF, no SRF (No interpretable images obtained).   Left Eye Quality was good. Central Foveal Thickness: 262. Progression has been stable. Findings include normal foveal contour, no IRF, no SRF, intraretinal hyper-reflective material (Stable improvement in IRF and IRHM; no SRF ).   Notes *Images captured and stored on drive  Diagnosis / Impression:  OD: No interpretable images obtained OS: Stable improvement in IRF and IRHM; no SRF   Clinical management:  See below  Abbreviations: NFP - Normal foveal profile. CME - cystoid macular edema. PED - pigment epithelial detachment. IRF - intraretinal fluid. SRF - subretinal fluid. EZ - ellipsoid zone. ERM - epiretinal membrane. ORA - outer retinal atrophy. ORT - outer retinal tubulation. SRHM - subretinal  hyper-reflective material. IRHM - intraretinal hyper-reflective material      Intravitreal Injection, Pharmacologic Agent - OS - Left Eye       Time Out 05/27/2024. 3:18 PM. Confirmed correct patient, procedure,  site, and patient consented.   Anesthesia Topical anesthesia was used. Anesthetic medications included Lidocaine 2%, Proparacaine 0.5%.   Procedure Preparation included 5% betadine to ocular surface, eyelid speculum. A supplied (32g) needle was used.   Injection: 1.25 mg Bevacizumab  1.25mg /0.59ml   Route: Intravitreal, Site: Left Eye   NDC: [id number], Lot: [id number], Expiration date: 06/27/2024   Post-op Post injection exam found visual acuity of at least counting fingers. The patient tolerated the procedure well. There were no complications. The patient received written and verbal post procedure care education.            ASSESSMENT/PLAN:    ICD-10-CM   1. Branch retinal vein occlusion of left eye with macular edema  H34.8320 OCT, Retina - OU - Both Eyes    Intravitreal Injection, Pharmacologic Agent - OS - Left Eye    Bevacizumab  (AVASTIN ) SOLN 1.25 mg    2. Essential hypertension  I10     3. Hypertensive retinopathy of both eyes  H35.033     4. History of retinal tear  Z86.69     5. S/P PKP (penetrating keratoplasty)  Z94.7     6. 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 BRVO w/ CME OS - pt reports spikes in BP as adjustments have been made with thyroid and BP medications  - pt reports SBP max of 190s several times - FA 03.12.24 shows mild perivascular leakage SN macula - s/p IVA OS #1 (03.12.24), #2 (04.09.24), #3 (05.07.24), #4 (06.04.24), #5 (07.09.24), #6 (08.13.24), #7 (09.17.24), #8 (10.29.24), #9 (12.17.24), #10 (02.18.25), #11 (05.06.25) - BCVA OS  20/25 - stable - exam shows small cluster of IRH and exudates SN macula improved - OCT shows Stable improvement in IRF and IRHM; no SRF at 14 weeks - recommend IVA OS #12 today, 08.13.25 - maintenance with follow up extended 16 weeks.  - RBA of procedure discussed, questions answered - IVA informed consent obtained and signed, 03.12.24 - see procedure note - F/U 16 weeks -- DFE/OCT/possible injection  2,3. Hypertensive retinopathy OU - discussed importance of tight BP control and relation to #1 above - monitor  4. Hx of retinal tears OU  - s/p laser retinopexy with Dr. Alvia (Aug 2015; OD 0800, OS 0300)  - stable  - no new RT/RD  5-8. 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 Caresse - pt saw Dr. Maree on 04.16.24 for evaluation and management of uveitis / ocular inflammation - work up initiated by Dr. Maree and pt was started on Cellcept 500 mg BID - s/p PK surgery OD (Dr. Caresse, 10.31.24) -- graft clear and doing well - Cellcept d/c'd and Valtrex continued per Dr. Caresse  9. Mixed Cataract OU - The symptoms of cataract, surgical options, and treatments and risks were discussed with patient. - discussed diagnosis and progression - likely visually significant  10. Ocular hypertension - IOP today: 12,14  - Brimonidine  BID OU  Ophthalmic Meds Ordered this visit:  Meds ordered this encounter  Medications   Bevacizumab  (AVASTIN ) SOLN 1.25 mg     Return in about 16 weeks (around 09/16/2024) for BRVO OS, DFE, OCT, Possible Injxn.  There are no Patient Instructions on file for this visit.   Explained the diagnoses, plan, and follow up with the patient and they expressed understanding.  Patient expressed understanding of the importance of proper follow up care.   This document serves as a record of services personally performed by Redell JUDITHANN Hans, MD, PhD. It was created on their behalf by Avelina Pereyra, COA an ophthalmic technician. The creation of this record is This document serves as a record of services personally performed by Redell JUDITHANN Hans, MD, PhD. It was created on their behalf by Almetta Pesa, an ophthalmic technician. The creation of this record is the provider's dictation and/or activities during the visit.    Electronically signed by: Almetta Pesa, OA, 05/31/24  1:34 PM  Redell JUDITHANN Hans, M.D., Ph.D. Diseases & Surgery of the Retina and Vitreous [facility]  I have reviewed the above documentation for accuracy and completeness, and I agree with the above. Redell JUDITHANN Hans, M.D., Ph.D. 05/31/24 1:40 PM   Abbreviations: M myopia (nearsighted); A astigmatism; H hyperopia (farsighted); P presbyopia; Mrx spectacle prescription;  CTL contact lenses; OD right eye; OS left eye; OU both eyes  XT exotropia; ET esotropia; PEK punctate epithelial keratitis; PEE punctate epithelial erosions; DES dry eye syndrome; MGD meibomian gland dysfunction; ATs artificial tears; PFAT's preservative free artificial tears; NSC nuclear sclerotic cataract; PSC posterior subcapsular cataract; ERM epi-retinal membrane; PVD posterior vitreous detachment; RD retinal detachment; DM diabetes mellitus; DR diabetic retinopathy; NPDR non-proliferative diabetic retinopathy; PDR proliferative diabetic retinopathy; CSME clinically significant macular edema; DME diabetic macular edema; dbh dot blot hemorrhages; CWS cotton wool spot; POAG primary open angle glaucoma; C/D cup-to-disc ratio; HVF humphrey visual field; GVF goldmann visual field; OCT optical coherence tomography; IOP intraocular pressure; BRVO Branch retinal vein occlusion; CRVO central retinal vein occlusion; CRAO central retinal artery occlusion; BRAO branch retinal artery occlusion; RT retinal tear; SB scleral buckle; PPV pars plana vitrectomy; VH Vitreous hemorrhage; PRP panretinal laser photocoagulation; IVK intravitreal kenalog;  VMT vitreomacular traction; MH Macular hole;  NVD neovascularization of the disc; NVE neovascularization elsewhere; AREDS age related eye disease study; ARMD age related macular degeneration; POAG primary open angle glaucoma; EBMD epithelial/anterior basement membrane dystrophy; ACIOL anterior chamber intraocular lens; IOL intraocular lens; PCIOL posterior chamber intraocular lens; Phaco/IOL phacoemulsification with intraocular lens placement; PRK photorefractive keratectomy; LASIK laser assisted in situ keratomileusis; HTN hypertension; DM diabetes mellitus; COPD chronic obstructive pulmonary disease

## 2024-05-27 ENCOUNTER — Encounter (INDEPENDENT_AMBULATORY_CARE_PROVIDER_SITE_OTHER): Payer: Self-pay | Admitting: Ophthalmology

## 2024-05-27 ENCOUNTER — Ambulatory Visit (INDEPENDENT_AMBULATORY_CARE_PROVIDER_SITE_OTHER): Admitting: Ophthalmology

## 2024-05-27 DIAGNOSIS — H34832 Tributary (branch) retinal vein occlusion, left eye, with macular edema: Secondary | ICD-10-CM

## 2024-05-27 DIAGNOSIS — Z947 Corneal transplant status: Secondary | ICD-10-CM

## 2024-05-27 DIAGNOSIS — H179 Unspecified corneal scar and opacity: Secondary | ICD-10-CM

## 2024-05-27 DIAGNOSIS — Z8669 Personal history of other diseases of the nervous system and sense organs: Secondary | ICD-10-CM

## 2024-05-27 DIAGNOSIS — I10 Essential (primary) hypertension: Secondary | ICD-10-CM

## 2024-05-27 DIAGNOSIS — H25813 Combined forms of age-related cataract, bilateral: Secondary | ICD-10-CM

## 2024-05-27 DIAGNOSIS — H35033 Hypertensive retinopathy, bilateral: Secondary | ICD-10-CM

## 2024-05-27 DIAGNOSIS — B0050 Herpesviral ocular disease, unspecified: Secondary | ICD-10-CM

## 2024-05-27 DIAGNOSIS — H40053 Ocular hypertension, bilateral: Secondary | ICD-10-CM

## 2024-05-27 MED ORDER — BEVACIZUMAB CHEMO INJECTION 1.25MG/0.05ML SYRINGE FOR KALEIDOSCOPE
1.2500 mg | INTRAVITREAL | Status: AC | PRN
  Administered 2024-05-27 (×2): 1.25 mg via INTRAVITREAL

## 2024-09-07 NOTE — Progress Notes (Shared)
 [facility] - Clinic Note  09/16/2024     CHIEF COMPLAINT Patient presents for No chief complaint on file.   HISTORY OF PRESENT ILLNESS: Courtney Whitaker is a 68 y.o. female who presents to the clinic today for:      Pt states   Referring physician: No referring provider defined for this encounter.  HISTORICAL INFORMATION:   Selected notes from the MEDICAL RECORD NUMBER Referred by Dr. Kennyth for CSR LEE:  Ocular Hx- PMH-    CURRENT MEDICATIONS: Current Outpatient Medications (Ophthalmic Drugs)  Medication Sig   brimonidine  (ALPHAGAN ) 0.2 % ophthalmic solution INSTILL 1 DROP INTO BOTH EYES TWICE A DAY   DUREZOL 0.05 % EMUL Place 1 drop into the right eye daily.   No current facility-administered medications for this visit. (Ophthalmic Drugs)   Current Outpatient Medications (Other)  Medication Sig   aspirin EC 81 MG tablet Take 81 mg by mouth daily.   citalopram (CELEXA) 40 MG tablet Take 40 mg by mouth daily.   cyanocobalamin (VITAMIN B12) 1000 MCG tablet Take 1,000 mcg by mouth daily.   Glucosamine-Chondroitin 500-400 MG CAPS Take 1 tablet by mouth daily.   levothyroxine (SYNTHROID) 25 MCG tablet Take 25 mcg by mouth daily before breakfast.   losartan (COZAAR) 50 MG tablet Take 50 mg by mouth daily.   Magnesium Gluconate 27.5 MG TABS Take 1 tablet by mouth daily.   mesalamine (LIALDA) 1.2 g EC tablet Take 1.2 g by mouth daily with breakfast.   metoprolol succinate (TOPROL-XL) 25 MG 24 hr tablet Take 12.5 mg by mouth daily. (Patient not taking: Reported on 02/18/2024)   mycophenolate (CELLCEPT) 500 MG tablet Take 500 mg by mouth 2 (two) times daily.   Omega 3 1000 MG CAPS Take 1 capsule by mouth daily.   valACYclovir (VALTREX) 500 MG tablet Take 500 mg by mouth 2 (two) times daily.   No current facility-administered medications for this visit. (Other)   REVIEW OF SYSTEMS:   ALLERGIES Allergies  Allergen Reactions   Penicillins    PAST  MEDICAL HISTORY Past Medical History:  Diagnosis Date   Depression    Hypertension    Thyroid disease    Ulcerative colitis (HCC)    Past Surgical History:  Procedure Laterality Date   APPENDECTOMY     2023    FAMILY HISTORY Family History  Problem Relation Age of Onset   Diabetes Father    SOCIAL HISTORY Social History   Tobacco Use   Smoking status: Never   Smokeless tobacco: Never  Substance Use Topics   Alcohol use: Yes    Comment: Occasional, social   Drug use: Never       OPHTHALMIC EXAM:  Not recorded    IMAGING AND PROCEDURES  Imaging and Procedures for 09/16/2024          ASSESSMENT/PLAN:  No diagnosis found.  1. BRVO w/ CME OS - pt reports spikes in BP as adjustments have been made with thyroid and BP medications  - pt reports SBP max of 190s several times - FA 03.12.24 shows mild perivascular leakage SN macula - s/p IVA OS #1 (03.12.24), #2 (04.09.24), #3 (05.07.24), #4 (06.04.24), #5 (07.09.24), #6 (08.13.24), #7 (09.17.24), #8 (10.29.24), #9 (12.17.24), #10 (02.18.25), #11 (05.06.25), #12 (08.13.25) - BCVA OS 20/25 - stable - exam shows small cluster of IRH and exudates SN macula improved - OCT shows Stable improvement in IRF and IRHM; no SRF at 14 weeks - recommend IVA  OS #13 today, 12.03.25 - maintenance with follow up extended 16 weeks.  - RBA of procedure discussed, questions answered - IVA informed consent obtained and signed, 03.12.24 - see procedure note - F/U 16 weeks -- DFE/OCT/possible injection  2,3. Hypertensive retinopathy OU - discussed importance of tight BP control and relation to #1 above - monitor  4. Hx of retinal tears OU  - s/p laser retinopexy with Dr. Alvia (Aug 2015; OD 0800, OS 0300)  - stable  - no new RT/RD  5-8. 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 Caresse - pt saw Dr. Maree on 04.16.24 for evaluation and management of uveitis /  ocular inflammation - work up initiated by Dr. Maree and pt was started on Cellcept 500 mg BID - s/p PK surgery OD (Dr. Caresse, 10.31.24) -- graft clear and doing well - Cellcept d/c'd and Valtrex continued per Dr. Caresse  9. Mixed Cataract OU - The symptoms of cataract, surgical options, and treatments and risks were discussed with patient. 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    No follow-ups on file.  There are no Patient Instructions on file for this visit.   Explained the diagnoses, plan, and follow up with the patient and they expressed understanding.  Patient expressed understanding of the importance of proper follow up care.   This document serves as a record of services personally performed by Redell JUDITHANN Hans, MD, PhD. It was created on their behalf by Avelina Pereyra, COA an ophthalmic technician. The creation of this record is This document serves as a record of services personally performed by Redell JUDITHANN Hans, MD, PhD. It was created on their behalf by Almetta Pesa, an ophthalmic technician. The creation of this record is the provider's dictation and/or activities during the visit.    Electronically signed by: Almetta Pesa, OA, 09/07/24  10:53 AM  Redell JUDITHANN Hans, M.D., Ph.D. Diseases & Surgery of the Retina and Vitreous [facility]   Abbreviations: M myopia (nearsighted); A astigmatism; H hyperopia (farsighted); P presbyopia; Mrx spectacle prescription;  CTL contact lenses; OD right eye; OS left eye; OU both eyes  XT exotropia; ET esotropia; PEK punctate epithelial keratitis; PEE punctate epithelial erosions; DES dry eye syndrome; MGD meibomian gland dysfunction; ATs artificial tears; PFAT's preservative free artificial tears; NSC nuclear  sclerotic cataract; PSC posterior subcapsular cataract; ERM epi-retinal membrane; PVD posterior vitreous detachment; RD retinal detachment; DM diabetes mellitus; DR diabetic retinopathy; NPDR non-proliferative diabetic retinopathy; PDR proliferative diabetic retinopathy; CSME clinically significant macular edema; DME diabetic macular edema; dbh dot blot hemorrhages; CWS cotton wool spot; POAG primary open angle glaucoma; C/D cup-to-disc ratio; HVF humphrey visual field; GVF goldmann visual field; OCT optical coherence tomography; IOP intraocular pressure; BRVO Branch retinal vein occlusion; CRVO central retinal vein occlusion; CRAO central retinal artery occlusion; BRAO branch retinal artery occlusion; RT retinal tear; SB scleral buckle; PPV pars plana vitrectomy; VH Vitreous hemorrhage; PRP panretinal laser photocoagulation; IVK intravitreal kenalog; VMT vitreomacular traction; MH Macular hole;  NVD neovascularization of the disc; NVE neovascularization elsewhere; AREDS age related eye disease study; ARMD age related macular degeneration; POAG primary open angle glaucoma; EBMD epithelial/anterior basement membrane dystrophy; ACIOL anterior chamber intraocular lens; IOL intraocular lens; PCIOL posterior chamber  intraocular lens; Phaco/IOL phacoemulsification with intraocular lens placement; PRK photorefractive keratectomy; LASIK laser assisted in situ keratomileusis; HTN hypertension; DM diabetes mellitus; COPD chronic obstructive pulmonary disease

## 2024-09-15 NOTE — Progress Notes (Shared)
 [facility] - Clinic Note  09/22/2024     CHIEF COMPLAINT Patient presents for No chief complaint on file.   HISTORY OF PRESENT ILLNESS: Courtney Whitaker is a 68 y.o. female who presents to the clinic today for:      Pt states   Referring physician: No referring provider defined for this encounter.  HISTORICAL INFORMATION:   Selected notes from the MEDICAL RECORD NUMBER Referred by Dr. Kennyth for CSR LEE:  Ocular Hx- PMH-    CURRENT MEDICATIONS: Current Outpatient Medications (Ophthalmic Drugs)  Medication Sig   brimonidine  (ALPHAGAN ) 0.2 % ophthalmic solution INSTILL 1 DROP INTO BOTH EYES TWICE A DAY   DUREZOL 0.05 % EMUL Place 1 drop into the right eye daily.   No current facility-administered medications for this visit. (Ophthalmic Drugs)   Current Outpatient Medications (Other)  Medication Sig   aspirin EC 81 MG tablet Take 81 mg by mouth daily.   citalopram (CELEXA) 40 MG tablet Take 40 mg by mouth daily.   cyanocobalamin (VITAMIN B12) 1000 MCG tablet Take 1,000 mcg by mouth daily.   Glucosamine-Chondroitin 500-400 MG CAPS Take 1 tablet by mouth daily.   levothyroxine (SYNTHROID) 25 MCG tablet Take 25 mcg by mouth daily before breakfast.   losartan (COZAAR) 50 MG tablet Take 50 mg by mouth daily.   Magnesium Gluconate 27.5 MG TABS Take 1 tablet by mouth daily.   mesalamine (LIALDA) 1.2 g EC tablet Take 1.2 g by mouth daily with breakfast.   metoprolol succinate (TOPROL-XL) 25 MG 24 hr tablet Take 12.5 mg by mouth daily. (Patient not taking: Reported on 02/18/2024)   mycophenolate (CELLCEPT) 500 MG tablet Take 500 mg by mouth 2 (two) times daily.   Omega 3 1000 MG CAPS Take 1 capsule by mouth daily.   valACYclovir (VALTREX) 500 MG tablet Take 500 mg by mouth 2 (two) times daily.   No current facility-administered medications for this visit. (Other)   REVIEW OF SYSTEMS:   ALLERGIES Allergies  Allergen Reactions   Penicillins    PAST  MEDICAL HISTORY Past Medical History:  Diagnosis Date   Depression    Hypertension    Thyroid disease    Ulcerative colitis (HCC)    Past Surgical History:  Procedure Laterality Date   APPENDECTOMY     2023    FAMILY HISTORY Family History  Problem Relation Age of Onset   Diabetes Father    SOCIAL HISTORY Social History   Tobacco Use   Smoking status: Never   Smokeless tobacco: Never  Substance Use Topics   Alcohol use: Yes    Comment: Occasional, social   Drug use: Never       OPHTHALMIC EXAM:  Not recorded    IMAGING AND PROCEDURES  Imaging and Procedures for 09/22/2024          ASSESSMENT/PLAN:  No diagnosis found.  1. BRVO w/ CME OS - pt reports spikes in BP as adjustments have been made with thyroid and BP medications  - pt reports SBP max of 190s several times - FA 03.12.24 shows mild perivascular leakage SN macula - s/p IVA OS #1 (03.12.24), #2 (04.09.24), #3 (05.07.24), #4 (06.04.24), #5 (07.09.24), #6 (08.13.24), #7 (09.17.24), #8 (10.29.24), #9 (12.17.24), #10 (02.18.25), #11 (05.06.25), #12 (08.13.25) - BCVA OS 20/25 - stable - exam shows small cluster of IRH and exudates SN macula improved - OCT shows Stable improvement in IRF and IRHM; no SRF at 14 weeks - recommend IVA  OS #13 today, 12.16.25 - maintenance with follow up extended 16 weeks.  - RBA of procedure discussed, questions answered - IVA informed consent obtained and signed, 03.12.24 - see procedure note - F/U 16 weeks -- DFE/OCT/possible injection  2,3. Hypertensive retinopathy OU - discussed importance of tight BP control and relation to #1 above - monitor  4. Hx of retinal tears OU  - s/p laser retinopexy with Dr. Alvia (Aug 2015; OD 0800, OS 0300)  - stable  - no new RT/RD  5-8. 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 Caresse - pt saw Dr. Maree on 04.16.24 for evaluation and management of uveitis /  ocular inflammation - work up initiated by Dr. Maree and pt was started on Cellcept 500 mg BID - s/p PK surgery OD (Dr. Caresse, 10.31.24) -- graft clear and doing well - Cellcept d/c'd and Valtrex continued per Dr. Caresse  9. Mixed Cataract OU - The symptoms of cataract, surgical options, and treatments and risks were discussed with patient. 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    No follow-ups on file.  There are no Patient Instructions on file for this visit.   Explained the diagnoses, plan, and follow up with the patient and they expressed understanding.  Patient expressed understanding of the importance of proper follow up care.   This document serves as a record of services personally performed by Redell JUDITHANN Hans, MD, PhD. It was created on their behalf by Wanda GEANNIE Keens, COT an ophthalmic technician. The creation of this record is the provider's dictation and/or activities during the visit.    Electronically signed by:  Wanda GEANNIE Keens, COT  09/15/24 7:18 AM   Redell JUDITHANN Hans, M.D., Ph.D. Diseases & Surgery of the Retina and Vitreous [facility]   Abbreviations: M myopia (nearsighted); A astigmatism; H hyperopia (farsighted); P presbyopia; Mrx spectacle prescription;  CTL contact lenses; OD right eye; OS left eye; OU both eyes  XT exotropia; ET esotropia; PEK punctate epithelial keratitis; PEE punctate epithelial erosions; DES dry eye syndrome; MGD meibomian gland dysfunction; ATs artificial tears; PFAT's preservative free artificial tears; NSC nuclear sclerotic cataract; PSC posterior subcapsular cataract; ERM epi-retinal membrane; PVD posterior vitreous detachment; RD retinal detachment; DM diabetes mellitus; DR diabetic retinopathy; NPDR  non-proliferative diabetic retinopathy; PDR proliferative diabetic retinopathy; CSME clinically significant macular edema; DME diabetic macular edema; dbh dot blot hemorrhages; CWS cotton wool spot; POAG primary open angle glaucoma; C/D cup-to-disc ratio; HVF humphrey visual field; GVF goldmann visual field; OCT optical coherence tomography; IOP intraocular pressure; BRVO Branch retinal vein occlusion; CRVO central retinal vein occlusion; CRAO central retinal artery occlusion; BRAO branch retinal artery occlusion; RT retinal tear; SB scleral buckle; PPV pars plana vitrectomy; VH Vitreous hemorrhage; PRP panretinal laser photocoagulation; IVK intravitreal kenalog; VMT vitreomacular traction; MH Macular hole;  NVD neovascularization of the disc; NVE neovascularization elsewhere; AREDS age related eye disease study; ARMD age related macular degeneration; POAG primary open angle glaucoma; EBMD epithelial/anterior basement membrane dystrophy; ACIOL anterior chamber intraocular lens; IOL intraocular lens; PCIOL posterior chamber intraocular lens; Phaco/IOL phacoemulsification with intraocular lens placement; PRK photorefractive keratectomy; LASIK laser assisted in situ keratomileusis; HTN hypertension; DM diabetes mellitus; COPD chronic obstructive pulmonary disease

## 2024-09-16 ENCOUNTER — Encounter (INDEPENDENT_AMBULATORY_CARE_PROVIDER_SITE_OTHER): Admitting: Ophthalmology

## 2024-09-16 DIAGNOSIS — H40053 Ocular hypertension, bilateral: Secondary | ICD-10-CM

## 2024-09-16 DIAGNOSIS — H25813 Combined forms of age-related cataract, bilateral: Secondary | ICD-10-CM

## 2024-09-16 DIAGNOSIS — Z8669 Personal history of other diseases of the nervous system and sense organs: Secondary | ICD-10-CM

## 2024-09-16 DIAGNOSIS — Z947 Corneal transplant status: Secondary | ICD-10-CM

## 2024-09-16 DIAGNOSIS — H34832 Tributary (branch) retinal vein occlusion, left eye, with macular edema: Secondary | ICD-10-CM

## 2024-09-16 DIAGNOSIS — H179 Unspecified corneal scar and opacity: Secondary | ICD-10-CM

## 2024-09-16 DIAGNOSIS — B0050 Herpesviral ocular disease, unspecified: Secondary | ICD-10-CM

## 2024-09-16 DIAGNOSIS — I10 Essential (primary) hypertension: Secondary | ICD-10-CM

## 2024-09-16 DIAGNOSIS — H35033 Hypertensive retinopathy, bilateral: Secondary | ICD-10-CM

## 2024-09-22 ENCOUNTER — Encounter (INDEPENDENT_AMBULATORY_CARE_PROVIDER_SITE_OTHER): Admitting: Ophthalmology

## 2024-09-22 DIAGNOSIS — H34832 Tributary (branch) retinal vein occlusion, left eye, with macular edema: Secondary | ICD-10-CM

## 2024-09-22 DIAGNOSIS — B0050 Herpesviral ocular disease, unspecified: Secondary | ICD-10-CM

## 2024-09-22 DIAGNOSIS — H179 Unspecified corneal scar and opacity: Secondary | ICD-10-CM

## 2024-09-22 DIAGNOSIS — I10 Essential (primary) hypertension: Secondary | ICD-10-CM

## 2024-09-22 DIAGNOSIS — H40053 Ocular hypertension, bilateral: Secondary | ICD-10-CM

## 2024-09-22 DIAGNOSIS — Z947 Corneal transplant status: Secondary | ICD-10-CM

## 2024-09-22 DIAGNOSIS — H35033 Hypertensive retinopathy, bilateral: Secondary | ICD-10-CM

## 2024-09-22 DIAGNOSIS — H25813 Combined forms of age-related cataract, bilateral: Secondary | ICD-10-CM

## 2024-09-22 DIAGNOSIS — Z8669 Personal history of other diseases of the nervous system and sense organs: Secondary | ICD-10-CM

## 2024-09-24 NOTE — Progress Notes (Signed)
 [facility] - Clinic Note  09/29/2024     CHIEF COMPLAINT Patient presents for Retina Follow Up   HISTORY OF PRESENT ILLNESS: Courtney Whitaker is a 68 y.o. female who presents to the clinic today for:   HPI     Retina Follow Up   Patient presents with  CRVO/BRVO.  In left eye.  Severity is moderate.  Duration of 14 weeks.  Since onset it is stable.  I, the attending physician,  performed the HPI with the patient and updated documentation appropriately.        Comments   Pt states no changes with vision. Pt denies FOL/floaters/pain. Pt is taking Brimonidine  2-4 times per day OU. Pt uses Retain prn.       Last edited by Elnor Avelina RAMAN, COT on 09/29/2024  1:16 PM.       Pt states she had cataract sx OD w/ Dr. Mamie. Seeing Dr. Rosalind for a contact lens eval in [place].   Referring physician: No referring provider defined for this encounter.  HISTORICAL INFORMATION:   Selected notes from the MEDICAL RECORD NUMBER Referred by Dr. Kennyth for CSR LEE:  Ocular Hx- PMH-    CURRENT MEDICATIONS: Current Outpatient Medications (Ophthalmic Drugs)  Medication Sig   brimonidine  (ALPHAGAN ) 0.2 % ophthalmic solution INSTILL 1 DROP INTO BOTH EYES TWICE A DAY   DUREZOL 0.05 % EMUL Place 1 drop into the right eye daily.   No current facility-administered medications for this visit. (Ophthalmic Drugs)   Current Outpatient Medications (Other)  Medication Sig   aspirin EC 81 MG tablet Take 81 mg by mouth daily.   citalopram (CELEXA) 40 MG tablet Take 40 mg by mouth daily.   cyanocobalamin (VITAMIN B12) 1000 MCG tablet Take 1,000 mcg by mouth daily.   Glucosamine-Chondroitin 500-400 MG CAPS Take 1 tablet by mouth daily.   levothyroxine (SYNTHROID) 25 MCG tablet Take 25 mcg by mouth daily before breakfast.   losartan (COZAAR) 50 MG tablet Take 50 mg by mouth daily.   Magnesium Gluconate 27.5 MG TABS Take 1 tablet by mouth daily.   mesalamine (LIALDA) 1.2  g EC tablet Take 1.2 g by mouth daily with breakfast.   metoprolol succinate (TOPROL-XL) 25 MG 24 hr tablet Take 12.5 mg by mouth daily. (Patient not taking: Reported on 02/18/2024)   mycophenolate (CELLCEPT) 500 MG tablet Take 500 mg by mouth 2 (two) times daily.   Omega 3 1000 MG CAPS Take 1 capsule by mouth daily.   valACYclovir (VALTREX) 500 MG tablet Take 500 mg by mouth 2 (two) times daily.   No current facility-administered medications for this visit. (Other)   REVIEW OF SYSTEMS: ROS   Positive for: Gastrointestinal, Endocrine, Cardiovascular, Eyes, Psychiatric Negative for: Constitutional, Neurological, Skin, Genitourinary, Musculoskeletal, HENT, Respiratory, Allergic/Imm, Heme/Lymph Last edited by Elnor Avelina RAMAN, COT on 09/29/2024  1:16 PM.      ALLERGIES Allergies  Allergen Reactions   Penicillins    PAST MEDICAL HISTORY Past Medical History:  Diagnosis Date   Depression    Hypertension    Thyroid disease    Ulcerative colitis (HCC)    Past Surgical History:  Procedure Laterality Date   APPENDECTOMY     2023    FAMILY HISTORY Family History  Problem Relation Age of Onset   Diabetes Father    SOCIAL HISTORY Social History   Tobacco Use   Smoking status: Never   Smokeless tobacco: Never  Substance Use Topics  Alcohol use: Yes    Comment: Occasional, social   Drug use: Never       OPHTHALMIC EXAM:  Base Eye Exam     Visual Acuity (Snellen - Linear)       Right Left   Dist [place] 20/80 -1 20/40 +2   Dist ph [place] 20/50 20/25 -1         Tonometry (Tonopen, 1:12 PM)       Right Left   Pressure 14 21         Pupils       Pupils Dark Light Shape React APD   Right PERRL 3 2 Round Brisk None   Left PERRL 3 2 Round Brisk None         Visual Fields       Left Right    Full Full         Extraocular Movement       Right Left    Full, Ortho Full, Ortho         Neuro/Psych     Oriented x3: Yes         Dilation     Both  eyes: 1.0% Mydriacyl, 2.5% Phenylephrine @ 1:13 PM           IMAGING AND PROCEDURES  Imaging and Procedures for 09/29/2024          ASSESSMENT/PLAN:    ICD-10-CM   1. Branch retinal vein occlusion of left eye with macular edema (HCC)  Y65.1679     2. Essential hypertension  I10     3. Hypertensive retinopathy of both eyes  H35.033     4. History of retinal tear  Z86.69     5. S/P PKP (penetrating keratoplasty)  Z94.7     6. 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 BRVO w/ CME OS - pt reports spikes in BP as adjustments have been made with thyroid and BP medications  - pt reports SBP max of 190s several times - FA 03.12.24 shows mild perivascular leakage SN macula - s/p IVA OS #1 (03.12.24), #2 (04.09.24), #3 (05.07.24), #4 (06.04.24), #5 (07.09.24), #6 (08.13.24), #7 (09.17.24), #8 (10.29.24), #9 (12.17.24), #10 (02.18.25), #11 (05.06.25), #12 (08.13.25) - BCVA OS 20/25 - stable - exam shows small cluster of IRH and exudates SN macula improved - OCT shows Stable improvement in IRF and IRHM; no SRF at 17.9 weeks - recommend holding IVA OS #13 today, 12.16.25   - pt in agreement - F/U 5-6 weeks -- DFE/OCT/possible injection  2. CME OD s/p CE IOL OD  -Pt taking PF QID OD s/p CE OD 11.13.25, per Dr. Mamie- last appt was 12.05.25  -BCVA 20/50 improved from HM -OCT shows central IRF/edema  -See Dr. Mamie to f/u for swelling/inflammation OD--has f/u 01.09.25  -f/u here 5-6 weeks, DFE, OCT OU  3,4. Hypertensive retinopathy OU - discussed importance of tight BP control and relation to #1 above - monitor  5. Hx of retinal tears OU  - s/p laser retinopexy with Dr. Alvia (Aug 2015; OD 0800, OS 0300)  - stable  - no new RT/RD  6-9. Corneal scarring OD from  history of HSV keratitis and iritis/uveitis  -  under the expert management of Dr. Caresse - pt saw Dr. Maree on 04.16.24 for evaluation and management of uveitis / ocular inflammation - work up initiated by Dr. Maree and pt was started on Cellcept 500 mg BID - s/p PK surgery OD (Dr. Caresse, 10.31.24) -- graft clear and doing well - Cellcept d/c'd and Valtrex continued per Dr. Caresse   10. Mixed Cataract OS - The symptoms of cataract, surgical options, and treatments and risks were discussed with patient. - discussed diagnosis and progression - likely visually significant  11. Pseudophakia OD  - s/p CE/IOL w/ Dr. Mamie 11.13.25  - IOL in good position, doing well  - BCVA 20/50 from HM s/p CE OD  12. Ocular hypertension - IOP today: 14,21 - Brimonidine  BID OU  Ophthalmic Meds Ordered this visit:  No orders of the defined types were placed in this encounter.    No follow-ups on file.  There are no Patient Instructions on file for this visit.   Explained the diagnoses, plan, and follow up with the patient and they expressed understanding.  Patient expressed understanding of the importance of proper follow up care.   This document serves as a record of services personally performed by Redell JUDITHANN Hans, MD, PhD. It was created on their behalf by Wanda GEANNIE Keens, COT an ophthalmic technician. The creation of this record is the provider's dictation and/or activities during the visit.    Electronically signed by:  Wanda GEANNIE Keens, COT  09/29/2024 1:42 PM  This document serves as a record of services personally performed by Redell JUDITHANN Hans, MD, PhD. It was created on their behalf by Almetta Pesa, an ophthalmic technician. The creation of this record is the provider's dictation and/or activities during the visit.    Electronically signed by: Almetta Pesa, OA, 09/29/2024  2:01 PM   Redell JUDITHANN Hans, M.D., Ph.D. Diseases & Surgery of the Retina and  Vitreous [facility]   Abbreviations: M myopia (nearsighted); A astigmatism; H hyperopia (farsighted); P presbyopia; Mrx spectacle prescription;  CTL contact lenses; OD right eye; OS left eye; OU both eyes  XT exotropia; ET esotropia; PEK punctate epithelial keratitis; PEE punctate epithelial erosions; DES dry eye syndrome; MGD meibomian gland dysfunction; ATs artificial tears; PFAT's preservative free artificial tears; NSC nuclear sclerotic cataract; PSC posterior subcapsular cataract; ERM epi-retinal membrane; PVD posterior vitreous detachment; RD retinal detachment; DM diabetes mellitus; DR diabetic retinopathy; NPDR non-proliferative diabetic retinopathy; PDR proliferative diabetic retinopathy; CSME clinically significant macular edema; DME diabetic macular edema; dbh dot blot hemorrhages; CWS cotton wool spot; POAG primary open angle glaucoma; C/D cup-to-disc ratio; HVF humphrey visual field; GVF goldmann visual field; OCT optical coherence tomography; IOP intraocular pressure; BRVO Branch retinal vein occlusion; CRVO central retinal vein occlusion; CRAO central retinal artery occlusion; BRAO branch retinal artery occlusion; RT retinal tear; SB scleral buckle; PPV pars plana vitrectomy; VH Vitreous hemorrhage; PRP panretinal laser photocoagulation; IVK intravitreal kenalog; VMT vitreomacular traction; MH Macular hole;  NVD neovascularization of the disc; NVE neovascularization elsewhere; AREDS age related eye disease study; ARMD age related macular degeneration; POAG primary open angle glaucoma; EBMD epithelial/anterior basement membrane dystrophy; ACIOL anterior chamber intraocular lens; IOL intraocular lens; PCIOL posterior chamber intraocular lens; Phaco/IOL phacoemulsification with intraocular lens placement; PRK photorefractive keratectomy; LASIK laser assisted in situ keratomileusis; HTN hypertension; DM diabetes mellitus; COPD chronic obstructive pulmonary disease

## 2024-09-29 ENCOUNTER — Encounter (INDEPENDENT_AMBULATORY_CARE_PROVIDER_SITE_OTHER): Payer: Self-pay | Admitting: Ophthalmology

## 2024-09-29 ENCOUNTER — Ambulatory Visit (INDEPENDENT_AMBULATORY_CARE_PROVIDER_SITE_OTHER): Admitting: Ophthalmology

## 2024-09-29 DIAGNOSIS — Z961 Presence of intraocular lens: Secondary | ICD-10-CM

## 2024-09-29 DIAGNOSIS — I10 Essential (primary) hypertension: Secondary | ICD-10-CM

## 2024-09-29 DIAGNOSIS — Z8669 Personal history of other diseases of the nervous system and sense organs: Secondary | ICD-10-CM

## 2024-09-29 DIAGNOSIS — H35351 Cystoid macular degeneration, right eye: Secondary | ICD-10-CM

## 2024-09-29 DIAGNOSIS — H34832 Tributary (branch) retinal vein occlusion, left eye, with macular edema: Secondary | ICD-10-CM | POA: Diagnosis not present

## 2024-09-29 DIAGNOSIS — Z947 Corneal transplant status: Secondary | ICD-10-CM

## 2024-09-29 DIAGNOSIS — H40053 Ocular hypertension, bilateral: Secondary | ICD-10-CM | POA: Diagnosis not present

## 2024-09-29 DIAGNOSIS — H35033 Hypertensive retinopathy, bilateral: Secondary | ICD-10-CM

## 2024-09-29 DIAGNOSIS — B0050 Herpesviral ocular disease, unspecified: Secondary | ICD-10-CM

## 2024-09-29 DIAGNOSIS — H179 Unspecified corneal scar and opacity: Secondary | ICD-10-CM | POA: Diagnosis not present

## 2024-09-29 DIAGNOSIS — H25812 Combined forms of age-related cataract, left eye: Secondary | ICD-10-CM

## 2024-09-29 DIAGNOSIS — H25813 Combined forms of age-related cataract, bilateral: Secondary | ICD-10-CM

## 2024-10-05 ENCOUNTER — Other Ambulatory Visit (INDEPENDENT_AMBULATORY_CARE_PROVIDER_SITE_OTHER): Payer: Self-pay

## 2024-10-05 ENCOUNTER — Encounter (INDEPENDENT_AMBULATORY_CARE_PROVIDER_SITE_OTHER): Payer: Self-pay | Admitting: Ophthalmology

## 2024-10-20 NOTE — Progress Notes (Shared)
 " [facility] - Clinic Note  11/03/2024     CHIEF COMPLAINT Patient presents for No chief complaint on file.   HISTORY OF PRESENT ILLNESS: Courtney Whitaker is a 69 y.o. female who presents to the clinic today for:    Pt states she had cataract sx OD w/ Dr. Mamie. Seeing Dr. Rosalind for a contact lens eval in [place].   Referring physician: No referring provider defined for this encounter.  HISTORICAL INFORMATION:   Selected notes from the MEDICAL RECORD NUMBER Referred by Dr. Kennyth for CSR LEE:  Ocular Hx- PMH-    CURRENT MEDICATIONS: Current Outpatient Medications (Ophthalmic Drugs)  Medication Sig   brimonidine  (ALPHAGAN ) 0.2 % ophthalmic solution INSTILL 1 DROP INTO BOTH EYES TWICE A DAY   DUREZOL 0.05 % EMUL Place 1 drop into the right eye daily.   No current facility-administered medications for this visit. (Ophthalmic Drugs)   Current Outpatient Medications (Other)  Medication Sig   aspirin EC 81 MG tablet Take 81 mg by mouth daily.   citalopram (CELEXA) 40 MG tablet Take 40 mg by mouth daily.   cyanocobalamin (VITAMIN B12) 1000 MCG tablet Take 1,000 mcg by mouth daily.   Glucosamine-Chondroitin 500-400 MG CAPS Take 1 tablet by mouth daily.   levothyroxine (SYNTHROID) 25 MCG tablet Take 25 mcg by mouth daily before breakfast.   losartan (COZAAR) 50 MG tablet Take 50 mg by mouth daily.   Magnesium Gluconate 27.5 MG TABS Take 1 tablet by mouth daily.   mesalamine (LIALDA) 1.2 g EC tablet Take 1.2 g by mouth daily with breakfast.   metoprolol succinate (TOPROL-XL) 25 MG 24 hr tablet Take 12.5 mg by mouth daily. (Patient not taking: Reported on 02/18/2024)   mycophenolate (CELLCEPT) 500 MG tablet Take 500 mg by mouth 2 (two) times daily.   Omega 3 1000 MG CAPS Take 1 capsule by mouth daily.   valACYclovir (VALTREX) 500 MG tablet Take 500 mg by mouth 2 (two) times daily.   No current facility-administered medications for this visit. (Other)    REVIEW OF SYSTEMS:    ALLERGIES Allergies  Allergen Reactions   Penicillins    PAST MEDICAL HISTORY Past Medical History:  Diagnosis Date   Depression    Hypertension    Thyroid disease    Ulcerative colitis (HCC)    Past Surgical History:  Procedure Laterality Date   APPENDECTOMY     2023    FAMILY HISTORY Family History  Problem Relation Age of Onset   Diabetes Father    SOCIAL HISTORY Social History   Tobacco Use   Smoking status: Never   Smokeless tobacco: Never  Substance Use Topics   Alcohol use: Yes    Comment: Occasional, social   Drug use: Never       OPHTHALMIC EXAM:  Not recorded    IMAGING AND PROCEDURES  Imaging and Procedures for 11/03/2024           ASSESSMENT/PLAN:  No diagnosis found.   1. BRVO w/ CME OS - pt reports spikes in BP as adjustments have been made with thyroid and BP medications  - pt reports SBP max of 190s several times - FA 03.12.24 shows mild perivascular leakage SN macula - s/p IVA OS #1 (03.12.24), #2 (04.09.24), #3 (05.07.24), #4 (06.04.24), #5 (07.09.24), #6 (08.13.24), #7 (09.17.24), #8 (10.29.24), #9 (12.17.24), #10 (02.18.25), #11 (05.06.25), #12 (08.13.25) - BCVA OS 20/25 - stable - exam shows small cluster of IRH and exudates  SN macula improved - OCT shows Stable improvement in IRF and IRHM; no SRF at 17.9 weeks - recommend IVA OS #13 today, 01.20.26  - pt in agreement - F/U 5-6 weeks -- DFE/OCT/possible injection  2. CME OD s/p CE IOL OD  - Pt taking PF QID OD s/p CE OD 11.13.25, per Dr. Mamie- last appt was 12.05.25  - BCVA 20/50 improved from HM - OCT shows central IRF/edema -- ?CME  - Pt to see Dr. Caresse for post-op f/u OD on 01.09.25  - consider topical Pred + NSAID for CME vs STK -- will defer to Dr. Caresse  - f/u here 5-6 weeks, DFE, OCT OU  3,4. Hypertensive retinopathy OU - discussed importance of tight BP control and relation to #1 above - monitor  5. Hx of retinal  tears OU  - s/p laser retinopexy with Dr. Alvia (Aug 2015; OD 0800, OS 0300)  - stable  - no new RT/RD  6-9. 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 Caresse - pt saw Dr. Maree on 04.16.24 for evaluation and management of uveitis / ocular inflammation - work up initiated by Dr. Maree and pt was started on Cellcept 500 mg BID - s/p PK surgery OD (Dr. Caresse, 10.31.24) -- graft clear and doing well - Cellcept d/c'd and Valtrex continued per Dr. Caresse  10. Mixed Cataract OS - The symptoms of cataract, surgical options, and treatments and risks were discussed with patient. - discussed diagnosis and progression - likely visually significant  11. Pseudophakia OD  - s/p CE/IOL w/ Dr. Mamie 11.13.25  - IOL in good position, doing well  - BCVA 20/50 from HM s/p CE OD  - ?CME as above  12. Ocular hypertension - IOP today: 14,21 - Brimonidine  BID OU  Ophthalmic Meds Ordered this visit:  No orders of the defined types were placed in this encounter.    No follow-ups on file.  There are no Patient Instructions on file for this visit.   Explained the diagnoses, plan, and follow up with the patient and they expressed understanding.  Patient expressed understanding of the importance of proper follow up care.   This document serves as a record of services personally performed by Redell JUDITHANN Hans, MD, PhD. It was created on their behalf by Wanda GEANNIE Keens, COT an ophthalmic technician. The creation of this record is the provider's dictation and/or activities during the visit.    Electronically signed by:  Wanda GEANNIE Keens, COT  10/20/2024 12:25 PM   Redell JUDITHANN Hans, M.D., Ph.D. Diseases & Surgery of the Retina and Vitreous [facility]    Abbreviations: M myopia (nearsighted); A astigmatism; H hyperopia (farsighted); P presbyopia; Mrx spectacle prescription;  CTL contact  lenses; OD right eye; OS left eye; OU both eyes  XT exotropia; ET esotropia; PEK punctate epithelial keratitis; PEE punctate epithelial erosions; DES dry eye syndrome; MGD meibomian gland dysfunction; ATs artificial tears; PFAT's preservative free artificial tears; NSC nuclear sclerotic cataract; PSC posterior subcapsular cataract; ERM epi-retinal membrane; PVD posterior vitreous detachment; RD retinal detachment; DM diabetes mellitus; DR diabetic retinopathy; NPDR non-proliferative diabetic retinopathy; PDR proliferative diabetic retinopathy; CSME clinically significant macular edema; DME diabetic macular edema; dbh dot blot hemorrhages; CWS cotton wool spot; POAG primary open angle glaucoma; C/D cup-to-disc ratio; HVF humphrey visual field; GVF goldmann visual field; OCT optical coherence tomography; IOP intraocular pressure; BRVO Branch retinal vein occlusion; CRVO central  retinal vein occlusion; CRAO central retinal artery occlusion; BRAO branch retinal artery occlusion; RT retinal tear; SB scleral buckle; PPV pars plana vitrectomy; VH Vitreous hemorrhage; PRP panretinal laser photocoagulation; IVK intravitreal kenalog; VMT vitreomacular traction; MH Macular hole;  NVD neovascularization of the disc; NVE neovascularization elsewhere; AREDS age related eye disease study; ARMD age related macular degeneration; POAG primary open angle glaucoma; EBMD epithelial/anterior basement membrane dystrophy; ACIOL anterior chamber intraocular lens; IOL intraocular lens; PCIOL posterior chamber intraocular lens; Phaco/IOL phacoemulsification with intraocular lens placement; PRK photorefractive keratectomy; LASIK laser assisted in situ keratomileusis; HTN hypertension; DM diabetes mellitus; COPD chronic obstructive pulmonary disease "

## 2024-11-03 ENCOUNTER — Encounter (INDEPENDENT_AMBULATORY_CARE_PROVIDER_SITE_OTHER): Admitting: Ophthalmology

## 2024-11-03 DIAGNOSIS — B0050 Herpesviral ocular disease, unspecified: Secondary | ICD-10-CM

## 2024-11-03 DIAGNOSIS — H35351 Cystoid macular degeneration, right eye: Secondary | ICD-10-CM

## 2024-11-03 DIAGNOSIS — H35033 Hypertensive retinopathy, bilateral: Secondary | ICD-10-CM

## 2024-11-03 DIAGNOSIS — Z8669 Personal history of other diseases of the nervous system and sense organs: Secondary | ICD-10-CM

## 2024-11-03 DIAGNOSIS — Z947 Corneal transplant status: Secondary | ICD-10-CM

## 2024-11-03 DIAGNOSIS — H179 Unspecified corneal scar and opacity: Secondary | ICD-10-CM

## 2024-11-03 DIAGNOSIS — I10 Essential (primary) hypertension: Secondary | ICD-10-CM

## 2024-11-03 DIAGNOSIS — Z961 Presence of intraocular lens: Secondary | ICD-10-CM

## 2024-11-03 DIAGNOSIS — H34832 Tributary (branch) retinal vein occlusion, left eye, with macular edema: Secondary | ICD-10-CM

## 2024-11-03 DIAGNOSIS — H25812 Combined forms of age-related cataract, left eye: Secondary | ICD-10-CM
